# Patient Record
Sex: Male | Born: 1960 | Race: White | Hispanic: No | Marital: Single | Smoking: Current every day smoker
Health system: Southern US, Community
[De-identification: ages and names within clinical notes are randomized; demographics above are authoritative.]

## PROBLEM LIST (undated history)

## (undated) DIAGNOSIS — M109 Gout, unspecified: Secondary | ICD-10-CM

## (undated) DIAGNOSIS — M1990 Unspecified osteoarthritis, unspecified site: Secondary | ICD-10-CM

## (undated) DIAGNOSIS — D649 Anemia, unspecified: Secondary | ICD-10-CM

## (undated) DIAGNOSIS — Z9289 Personal history of other medical treatment: Secondary | ICD-10-CM

## (undated) DIAGNOSIS — R569 Unspecified convulsions: Secondary | ICD-10-CM

## (undated) HISTORY — PX: OTHER SURGICAL HISTORY: SHX169

## (undated) HISTORY — PX: LAPAROTOMY: SHX154

## (undated) HISTORY — PX: COLOSTOMY REVERSAL: SHX5782

---

## 2001-11-15 ENCOUNTER — Emergency Department (HOSPITAL_COMMUNITY): Admission: EM | Admit: 2001-11-15 | Discharge: 2001-11-16 | Payer: Self-pay | Admitting: Emergency Medicine

## 2001-11-16 ENCOUNTER — Encounter: Payer: Self-pay | Admitting: Emergency Medicine

## 2002-07-13 ENCOUNTER — Emergency Department (HOSPITAL_COMMUNITY): Admission: EM | Admit: 2002-07-13 | Discharge: 2002-07-13 | Payer: Self-pay | Admitting: Emergency Medicine

## 2002-08-11 ENCOUNTER — Emergency Department (HOSPITAL_COMMUNITY): Admission: EM | Admit: 2002-08-11 | Discharge: 2002-08-11 | Payer: Self-pay | Admitting: Emergency Medicine

## 2002-10-01 ENCOUNTER — Ambulatory Visit (HOSPITAL_COMMUNITY): Admission: RE | Admit: 2002-10-01 | Discharge: 2002-10-01 | Payer: Self-pay | Admitting: Family Medicine

## 2002-10-01 ENCOUNTER — Encounter: Payer: Self-pay | Admitting: Family Medicine

## 2002-10-22 ENCOUNTER — Ambulatory Visit (HOSPITAL_COMMUNITY): Admission: RE | Admit: 2002-10-22 | Discharge: 2002-10-22 | Payer: Self-pay | Admitting: Gastroenterology

## 2002-10-22 ENCOUNTER — Encounter: Payer: Self-pay | Admitting: Gastroenterology

## 2003-09-12 ENCOUNTER — Emergency Department (HOSPITAL_COMMUNITY): Admission: EM | Admit: 2003-09-12 | Discharge: 2003-09-12 | Payer: Self-pay | Admitting: Emergency Medicine

## 2003-09-13 ENCOUNTER — Inpatient Hospital Stay (HOSPITAL_COMMUNITY): Admission: EM | Admit: 2003-09-13 | Discharge: 2003-09-21 | Payer: Self-pay | Admitting: Emergency Medicine

## 2003-09-29 ENCOUNTER — Emergency Department (HOSPITAL_COMMUNITY): Admission: EM | Admit: 2003-09-29 | Discharge: 2003-09-29 | Payer: Self-pay | Admitting: Emergency Medicine

## 2004-04-02 IMAGING — CT CT ABDOMEN W/ CM
1 of 3 series · 14 of 32 positions shown, 19 images · IV contrast (omnipaque)
Comparison: none

CLINICAL DATA: Abdominal wall cellulitis.  Evaluate for abdominal or pelvic abscess.  Abdominal pain.
TECHNIQUE: Multidetector helical CT of the abdomen and pelvis was performed during administration of 150 cc of Omnipaque 300 intravenous contrast.  Oral contrast was also administered.
 Comparison is made with the prior exam of 10/01/02.
 ABDOMEN CT WITH CONTRAST
 The liver, gallbladder, spleen, pancreas, and adrenal glands are normal in appearance.  Several tiny renal cysts are seen but the kidneys are otherwise unremarkable.  
 A large amount of foodstuff is seen within the stomach but there is no evidence of dilated or thickened bowel loops.  There is no evidence of other inflammatory processes or abnormal fluid collections.  Surgical anastomotic staples are seen involving small bowel loops in the right abdomen.  The patient has apparently undergone previous right colectomy.
 IMPRESSION
 1.  No evidence of acute intra-abdominal process.
 2.  Tiny bilateral renal cysts incidentally noted.
 PELVIS CT WITH CONTRAST
 There is no evidence of pelvic masses or adenopathy.  There is no evidence of an inflammatory process or abnormal fluid collection in the pelvis.  Pelvic bowel loops are unremarkable in appearance.
 Negative pelvis CT.

[Series 2: abd/pelvis 5.0 b30f · axial · 0.74mm/px · z∈[-402,-22]mm · 14 of 86 slices shown, 19 images]
[im 5/86  soft-tissue]
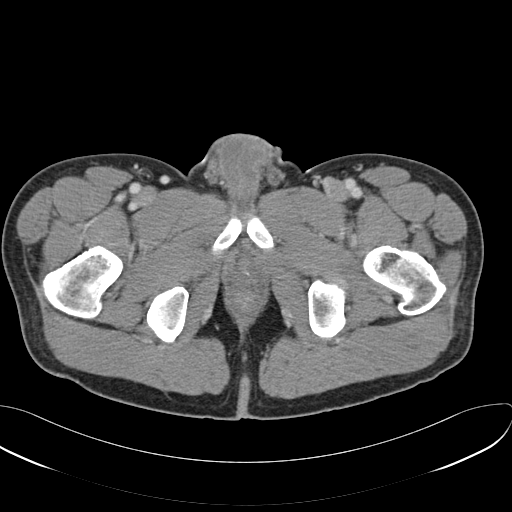
[im 5/86  bone]
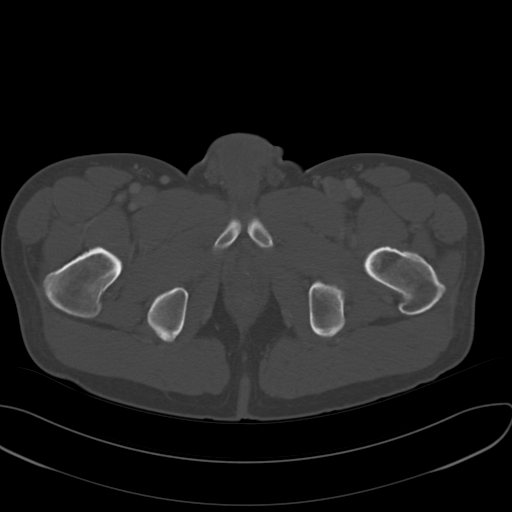
[im 13/86  soft-tissue]
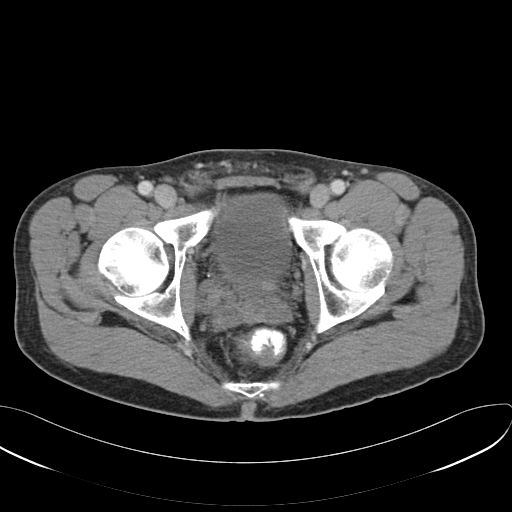
[im 18/86  soft-tissue]
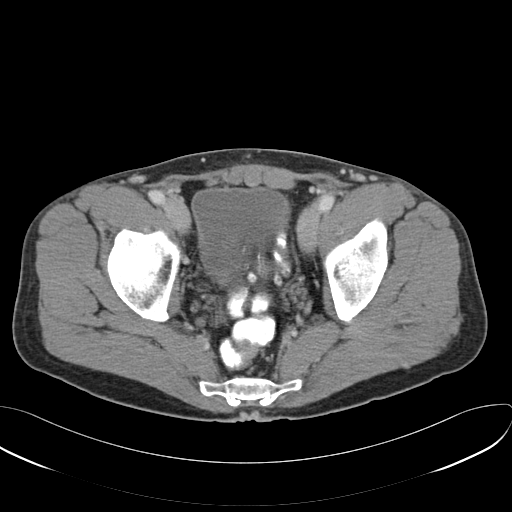
[im 26/86  soft-tissue]
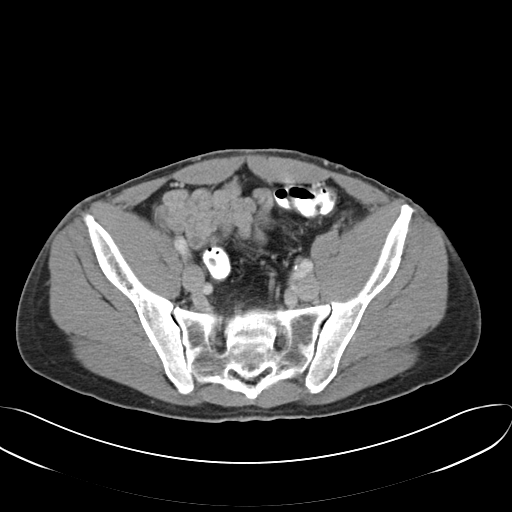
[im 30/86  soft-tissue]
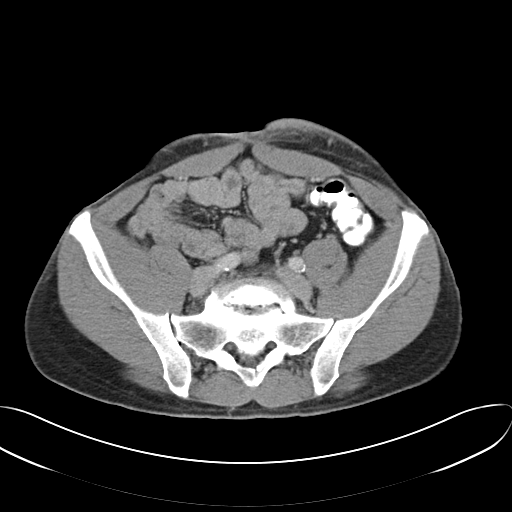
[im 39/86  soft-tissue]
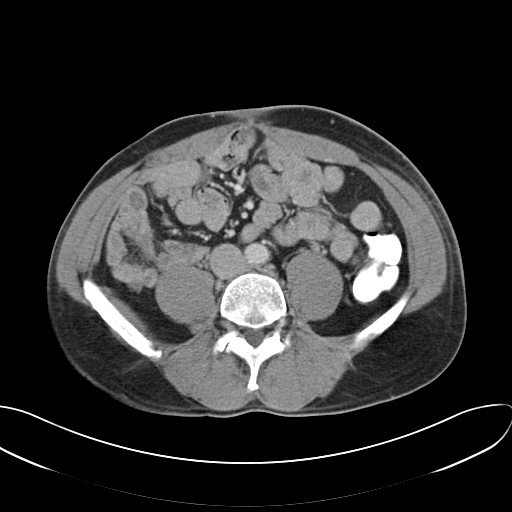
[im 43/86  soft-tissue]
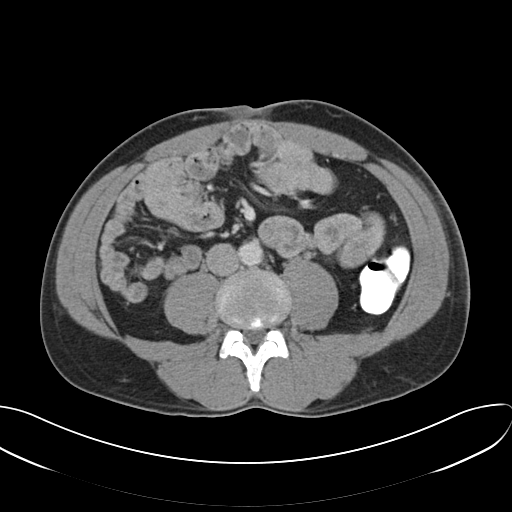
[im 47/86  soft-tissue]
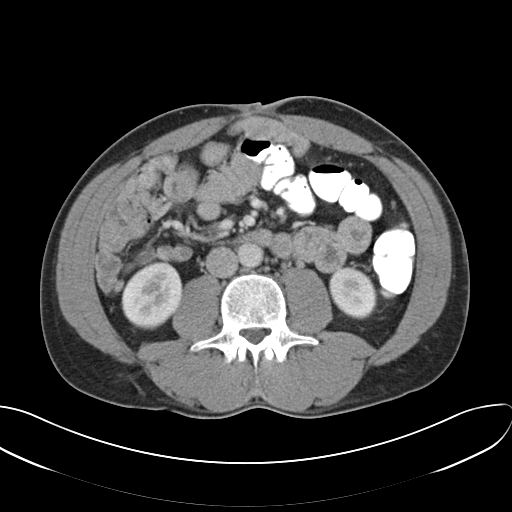
[im 56/86  soft-tissue]
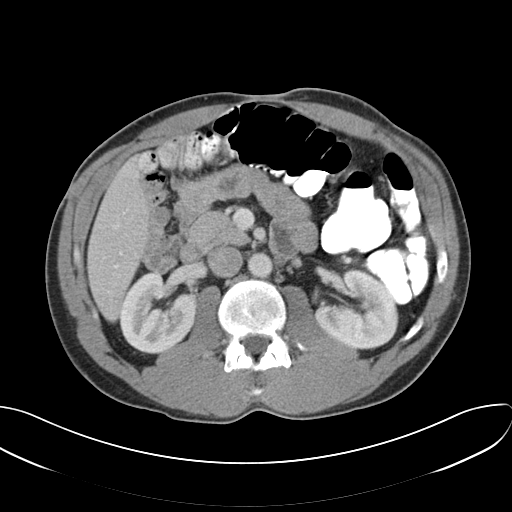
[im 56/86  bone]
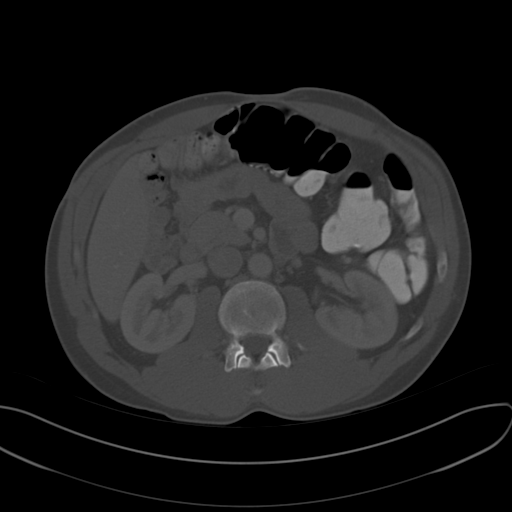
[im 60/86  soft-tissue]
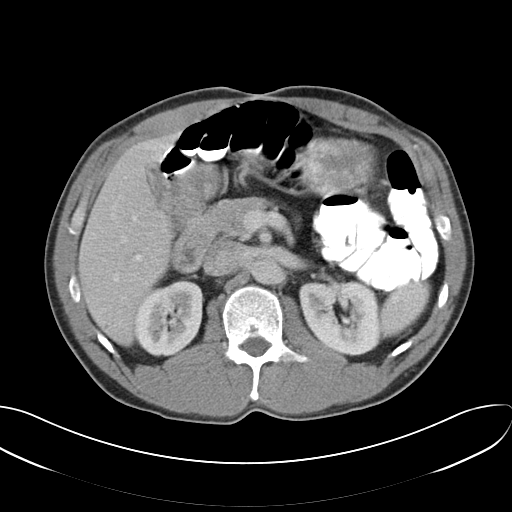
[im 69/86  soft-tissue]
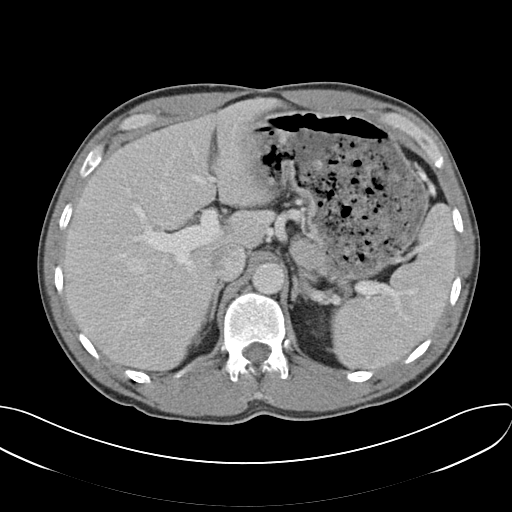
[im 69/86  lung]
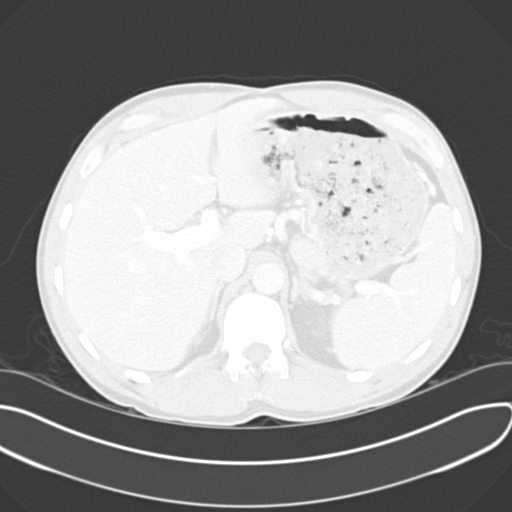
[im 73/86  soft-tissue]
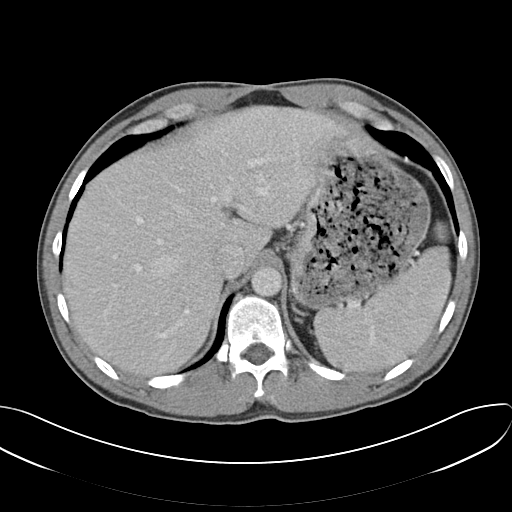
[im 73/86  lung]
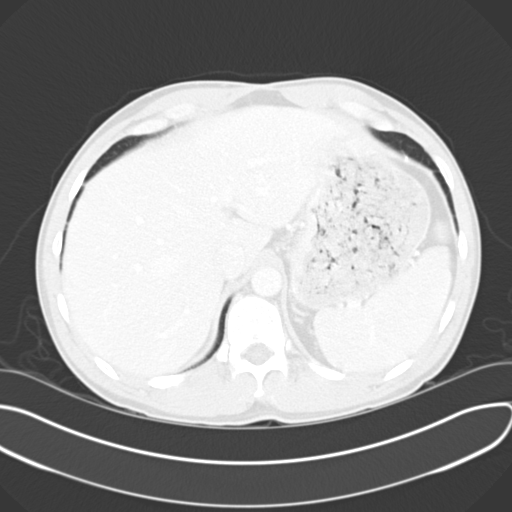
[im 77/86  lung]
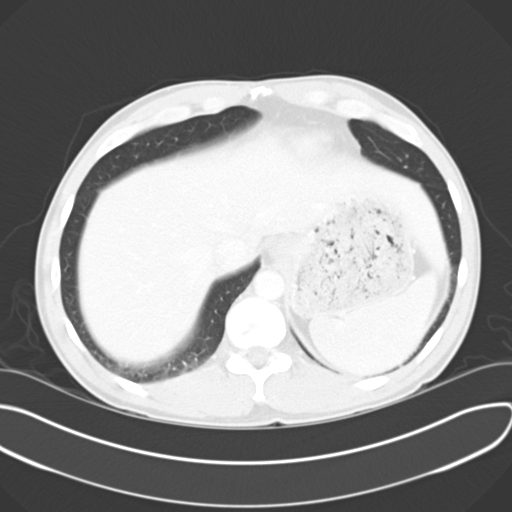
[im 81/86  soft-tissue]
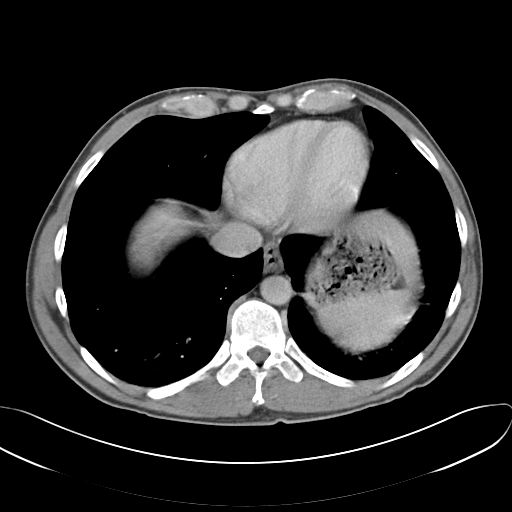
[im 81/86  lung]
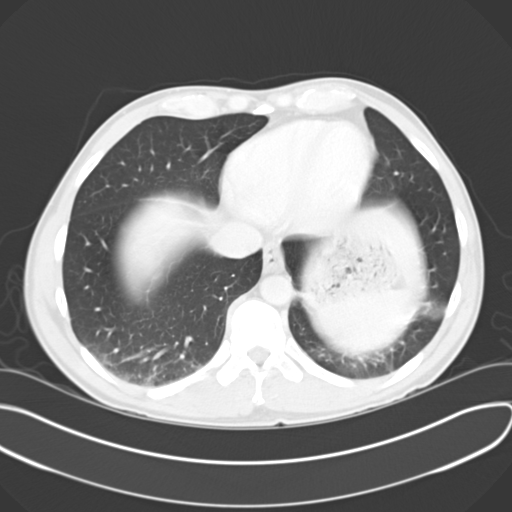

[14 of 32 positions shown; findings below may reference images not displayed]

## 2004-11-14 ENCOUNTER — Emergency Department (HOSPITAL_COMMUNITY): Admission: EM | Admit: 2004-11-14 | Discharge: 2004-11-14 | Payer: Self-pay | Admitting: Emergency Medicine

## 2006-06-05 ENCOUNTER — Emergency Department (HOSPITAL_COMMUNITY): Admission: EM | Admit: 2006-06-05 | Discharge: 2006-06-05 | Payer: Self-pay | Admitting: Emergency Medicine

## 2006-06-08 ENCOUNTER — Inpatient Hospital Stay (HOSPITAL_COMMUNITY): Admission: EM | Admit: 2006-06-08 | Discharge: 2006-06-11 | Payer: Self-pay | Admitting: Emergency Medicine

## 2006-06-08 ENCOUNTER — Encounter: Payer: Self-pay | Admitting: Vascular Surgery

## 2008-01-14 ENCOUNTER — Emergency Department (HOSPITAL_COMMUNITY): Admission: EM | Admit: 2008-01-14 | Discharge: 2008-01-14 | Payer: Self-pay | Admitting: Emergency Medicine

## 2008-01-31 ENCOUNTER — Ambulatory Visit (HOSPITAL_COMMUNITY): Admission: RE | Admit: 2008-01-31 | Discharge: 2008-01-31 | Payer: Self-pay | Admitting: Plastic Surgery

## 2009-07-18 ENCOUNTER — Emergency Department (HOSPITAL_COMMUNITY): Admission: EM | Admit: 2009-07-18 | Discharge: 2009-07-18 | Payer: Self-pay | Admitting: Emergency Medicine

## 2010-02-26 ENCOUNTER — Emergency Department (HOSPITAL_COMMUNITY): Admission: EM | Admit: 2010-02-26 | Discharge: 2010-02-27 | Payer: Self-pay | Admitting: Emergency Medicine

## 2010-09-16 LAB — DIFFERENTIAL
Basophils Absolute: 0 10*3/uL (ref 0.0–0.1)
Basophils Relative: 0 % (ref 0–1)
Eosinophils Absolute: 0 10*3/uL (ref 0.0–0.7)
Monocytes Absolute: 0.4 10*3/uL (ref 0.1–1.0)
Neutro Abs: 1.6 10*3/uL — ABNORMAL LOW (ref 1.7–7.7)

## 2010-09-16 LAB — BASIC METABOLIC PANEL
BUN: 11 mg/dL (ref 6–23)
CO2: 25 mEq/L (ref 19–32)
Calcium: 9.1 mg/dL (ref 8.4–10.5)
GFR calc non Af Amer: >60 mL/min (ref >60)
Glucose, Bld: 110 mg/dL — ABNORMAL HIGH (ref 70–99)
Potassium: 4.1 mEq/L (ref 3.5–5.1)

## 2010-09-16 LAB — CBC
HCT: 43.6 % (ref 39.0–52.0)
MCH: 31.8 pg (ref 26.0–34.0)
MCHC: 34.9 g/dL (ref 30.0–36.0)
RDW: 14.7 % (ref 11.5–15.5)
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
## 2010-11-19 NOTE — Discharge Summary (Signed)
NAME:  [name] [name], [name] [name]           ACCOUNT NO.:  [id number]   MEDICAL RECORD NO.:  [id number]          PATIENT TYPE:  INP   LOCATION:  1510                         FACILITY:  [facility]   PHYSICIAN:  Deirdre Peer. Polite, M.D. DATE OF BIRTH:  1961-07-01   DATE OF ADMISSION:  06/08/2006  DATE OF DISCHARGE:  06/11/2006                               DISCHARGE SUMMARY   DISCHARGE DIAGNOSES:  1. Prepatellar bursitis status post incision and drainage by      orthopedics consistent with MRSA.  2. Bronchitis.  3. Tobacco use.   DISCHARGE MEDICATIONS:  1. Doxicycline 100 mg 1 every 12 hours x10 days.  2. Percocet q.6 h p.r.n.   DISPOSITION:  For outpatient followup with primary MD and orthopedist as  needed.   CONSULTATIONS:  Dr. Carola Frost, orthopedics.   HISTORY OF PRESENT ILLNESS:  A 50 year old male presented to the ED for  evaluation of cellulitis of his knee. Of note, the patient had been seed  in the ED previously, had been started on antibiotics; however, did not  have improvement of his symptoms. He had continued symptoms of pain,  erythema and swelling. The patient returned to the ED, admission was  deemed necessary for further evaluation and treatment. Please see full  dictated H&P for further details.   HOSPITAL COURSE:  The patient is admitted to a medicine floor bed for  evaluation and treatment of cellulitis of the knee and associated  bronchitis. The patient was started on empiric IV antibiotics. The wound  did appear to be consistent with a community acquired MRSA infection.  The patient was seen by Dr. Carola Frost of orthopedics. The patient's wound  was I&D'd. The patient had local wound care. There were no intrahospital  complications. The patient had been cleared for discharge by  orthopedics. The patient was discharged to home in stable condition to  continue a 10-day course of doxicycline with outpatient followup with  orthopedics and primary MD. The patient was discharged to home  in stable  condition.      Deirdre Peer. Polite, M.D.  Electronically Signed     RDP/MEDQ  D:  06/30/2006  T:  06/30/2006  Job:  [id number]
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
## 2010-11-19 NOTE — Discharge Summary (Signed)
NAME:  [name] [name], [name] [name]                     ACCOUNT NO.:  [id number]   MEDICAL RECORD NO.:  [id number]                   PATIENT TYPE:  INP   LOCATION:  0477                                 FACILITY[facility]   PHYSICIAN:  Isla Pence, M.D.             DATE OF BIRTH:  08/15/1960   DATE OF ADMISSION:  09/13/2003  DATE OF DISCHARGE:  09/21/2003                                 DISCHARGE SUMMARY   DISCHARGE DIAGNOSES:  1. Abdominal wall abscess status post incision and drainage by Currie Paris, M.D. of general surgery. Vancomycin trough was done on the 16th     and that was at 18.6, this was adjusted appropriately by pharmacy and as     mentioned daily he is now off of vancomycin.  The abdominal wall abscess     grew MRSA.  2. History of anxiety and depression currently on Xanax and just recently     started on Remeron.  3. Tobacco abuse or dependence currently on nicotine patch.  4. History of alcohol abuse and dependence with a recommendation by [facility]     for ADS and AA followup.   DISCHARGE MEDICATIONS:  1. Xanax 0.25 mg p.o. b.i.d.  The patient has been given 34 of these tablets     with no refills.  2. Doxycycline 100 mg p.o. b.i.d.  He is to complete all of this. His last     dose will be after the last dose on April 1.  3. Remeron 7.5 mg p.o. q.h.s. and the recommendation for this is that he is     to stay on this dose for another 4 more days and then it will be     increased to 7.5 t.i.d. or 22.5 mg q.h.s. until he sees Dr. Donette Larry. It is     the recommendation of psychiatry, Dr. Jeanie Sewer, that as Remeron is     increased to try and taper patient off of Xanax.  4. MS Contin 15 mg p.o. b.i.d.  The patient will be given 34 tablets of     these with no refills.  5. Nicotine patch 21 mg topically once a day for one more week and then he     will be down to 14 mg topically to be then adjusted by Dr. Donette Larry.  6. Percocet 5/3.25, 1-2 tablets p.o. q. 4-6h p.r.n.   The patient has been     given 50 of these tablets with no refills.  7. Benadryl 25 mg p.o. q.i.d. p.r.n. itch.  8. Protonix 40 mg p.o. q.d.  He probably just needs this for another week or     so for GI prophylaxis.   DISCHARGE ACTIVITIES:  As tolerated.  The patient has been given the okay by  general surgery to take showers.   DIET:  There is no restrictions.   WOUND CARE:  The patient is to apply Betadine to the  wound two times a day  and dress it. He is also to do warm soaks to the wound. The wife has been  taught how to do these.   In terms of followup visit, the patient did not have a primary care visit  prior to this admission but he was willing to see someone within the [facility].  Therefore the patient was set up an appointment with Georgann Housekeeper, M.D. on  April 5 at 10:30 in the morning with [facility] internal medicine. The  patient was given the main number to call if he was going to make any  changes to this but the patient has been advised that all of his medication  changes and refills will be done through Georgann Housekeeper, M.D. and that it was  important for him to have a physician especially in light of the fact that  he is on benzodiazepine and will need adjustments on his Remeron.   HOSPITAL COURSE:  This 50 year old gentleman was admitted on the 12th by Dr.  Ladona Ridgel with abdominal cellulitis.  The patient had presented to the  emergency room on the day prior to admission with increased abdominal pain,  swelling of the site of what he thought was a spider bite. The patient was  seen by the emergency room and provided with ceftriaxone injection and sent  home with pain medication and Keflex 500.  Overnight the patient continued  to have increased redness, swelling and extension of the area to the left  flank. He had some chills and some vomiting with coughing so he returned to  the emergency room on the day of admission for further evaluation.  The  patient and his  wife related that they had recently had a small area looking  similar on her abdomen that excressed quite a bit of pus and then resolved  spontaneously. She apparently also had one on her thigh and this was also  resolved. They have scoured their home looking for possible source for these  spiders and could not locate any but they have also fully cleaned out their  house and laundered their beddings in hot water.  At the time of admission,  the findings were as listed with the area of redness that extended out into  the left flank area.  His initial white count was essentially normal at  8.7000 with differential showing neutrophils are 74%, lymphocytes art 17,  H&H was 15.5 and 46.2, platelet count of 184,000.  His repeat white count  done on the following day after IV antibiotics was initiated showed a white  count still normal at 7.6,000, H&H was stable at 14.1, 43.4, platelet count  of 170,00. His initial CNP was fairly unremarkable with a sodium of 137,  potassium of 5, chloride and CO2 106 and 27, glucose 108.  BUN and  creatinine were normal at 11 and 0.9, his LFT's were normal.  The patient  was started by Dr. Ladona Ridgel on vancomycin and Zosyn. Both these antibiotics  were continued until we obtained culture results back when the wound  starting draining two days post his admission. The wound culture had  subsequently grown MRSA.  The patient was continued on his vancomycin  through March 18 and on the 19th he was switched to doxycycline. I spoke to  infectious disease, Dr. Orvan Falconer, not as an official consult but discussed  his case with Dr. Orvan Falconer and since his CT of the abdomen and pelvis was  negative for any intraabdominal abscesses  this was a localized skin  infection.  Infectious disease had said that we could use oral antibiotics.  The MRSA was sensitive to tetracycline group.  I had asked per the advice  from ID to call up [facility] to do sensitivities on clindamycin  and bacterium and these too have come back sensitive towards his organism.  This  was recommended for possibility of future recurrence so that these options  are also available for future treatments once he completed the IV course.  In addition, three days prior to discharge, the area had localized to an  area of induration of about 5 cm.  General surgery was consulted and Dr.  Jamey Ripa saw him in consultation to perform an I&D and this was done on the  18th of March. Since then his culture from that has also grown the same  showing MRSA.  The patient's wound has been dressed and it is on the day of  discharge approximating very well without needing the iodoform in site  packing.  The wife is familiar with doing abdominal wound dressings since he  has prior abdominal surgeries and she needed to do these dressing changes  during that time also.  His blood cultures done on the day of admission  which was just one set has remained negative. The patient has remained  afebrile during the entire course except for one episode of a low grade  temperature at 99.   The patient also gives history of alcohol abuse with concerns that to have  some pain coverage Dr. Ladona Ridgel had just placed him on MS Contin and had also  allowed him small doses of Dilaudid and Percocet. The patient is going to be  going home with the MS Contin and Percocet as listed above under the  discharge medications.  The patient has been told about these medications.   With concerns of history of previous depression and anxiety and him having  some kind of reactions to previous SSRI and the concern for his alcohol  abuse in the past, I consulted Dr. Jeanie Sewer of inpatient psychiatry to see  this patient for appropriate treatment. Dr. Jeanie Sewer recommended Remeron  which the patient was willing to take and he was initiated at 7.5 mg p.o.  q.h.s. which he appears to be tolerating at this point in time. The plan is  as the Remeron is  increased that his xanax can be tapered to off. It was  also recommended by Dr. Jeanie Sewer that if he tolerates Remeron to add Celexa  at 5 mg q.a.m. to start where Remeron will block the 5H2 receptors.  This  will help with minimizing __________ problems with the Celexa.  Dr.  Jeanie Sewer had also recommended to the patient to have ADS and AA followup.   The patient's last BNP on the 17th of March was still normal with a  creatinine of 1.4.  Sodium is 130, potassium 4.3, glucose of 99.   The patient is being discharged to home in stable condition with a followup  and wound care as previously mentioned.                                               Isla Pence, M.D.    RRV/MEDQ  D:  09/21/2003  T:  09/23/2003  Job:  [id number]   cc:   Georgann Housekeeper,  M.D.  [address]  [place]  [place] [address]  Fax: [phone]
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
## 2010-11-19 NOTE — Op Note (Signed)
NAME:  [name] [name], [name] [name]           ACCOUNT NO.:  [id number]   MEDICAL RECORD NO.:  [id number]          PATIENT TYPE:  INP   LOCATION:  1510                         FACILITY:  [facility]   PHYSICIAN:  Doralee Albino. Carola Frost, M.D. DATE OF BIRTH:  12-Jun-1961   DATE OF PROCEDURE:  06/08/2006  DATE OF DISCHARGE:                               OPERATIVE REPORT   PREOPERATIVE DIAGNOSES:  Right prepatellar septic bursitis.   POSTOPERATIVE DIAGNOSES:  Right prepatellar septic bursitis.   OPERATIVE PROCEDURE:  Incision of the prepatellar bursa and drainage.   SURGEON:  Doralee Albino. Carola Frost, M.D.   ASSISTANT:  None.   ANESTHESIA:  Local using 4 mL of lidocaine.   SPECIMENS:  Two aerobic and anaerobic cultures obtained from the bursal  space.   DRAINS:  None.   COMPLICATIONS:  None.   FINDINGS:  The patient did not appear to have any loculated infection or  abscess in the prepatellar space.  We did have a fairly aggressive look  into the prepatellar space making 1.5 cm incision and using the scissors  to spread into this area.  The cut was made quite deeply as well and at  this time it simply appears there is no loculated abscess.  Scissor tips  were also used to lance some of the pustules and these did not  communicate deeply into any discernible pocket.   RECOMMENDATIONS:  At this time I would simply mobilize the knee, apply  compressive dressing and continue with IV antibiotics.  The cultures  will be followed up on and further recommendations based upon resolution  of the infection with IV antibiotics only.      Doralee Albino. Carola Frost, M.D.  Electronically Signed     MHH/MEDQ  D:  06/08/2006  T:  06/09/2006  Job:  [id number]

## 2011-04-01 LAB — CBC
Hemoglobin: 16.4
MCHC: 33.5
Platelets: 195
RDW: 14.7

## 2011-04-01 LAB — BASIC METABOLIC PANEL
BUN: 9
Calcium: 9.7
Creatinine, Ser: 1.1
GFR calc non Af Amer: >60
Glucose, Bld: 91
Sodium: 142

## 2012-09-04 ENCOUNTER — Emergency Department (HOSPITAL_COMMUNITY): Payer: Self-pay

## 2012-09-04 ENCOUNTER — Encounter (HOSPITAL_COMMUNITY): Payer: Self-pay | Admitting: Family Medicine

## 2012-09-04 ENCOUNTER — Inpatient Hospital Stay (HOSPITAL_COMMUNITY)
Admission: EM | Admit: 2012-09-04 | Discharge: 2012-09-10 | DRG: 494 | Disposition: A | Discharge status: Home Health | Payer: MEDICAID | Attending: Orthopedic Surgery | Admitting: Orthopedic Surgery

## 2012-09-04 DIAGNOSIS — Z888 Allergy status to other drugs, medicaments and biological substances status: Secondary | ICD-10-CM

## 2012-09-04 DIAGNOSIS — S82001A Unspecified fracture of right patella, initial encounter for closed fracture: Secondary | ICD-10-CM

## 2012-09-04 DIAGNOSIS — Y92009 Unspecified place in unspecified non-institutional (private) residence as the place of occurrence of the external cause: Secondary | ICD-10-CM

## 2012-09-04 DIAGNOSIS — S82009A Unspecified fracture of unspecified patella, initial encounter for closed fracture: Principal | ICD-10-CM | POA: Diagnosis present

## 2012-09-04 MED ORDER — HYDROMORPHONE HCL PF 2 MG/ML IJ SOLN
2.0000 mg | Freq: Once | INTRAMUSCULAR | Status: AC
  Administered 2012-09-04: 2 mg via INTRAVENOUS
  Filled 2012-09-04: qty 1

## 2012-09-04 MED ORDER — ONDANSETRON HCL 4 MG/2ML IJ SOLN
4.0000 mg | Freq: Once | INTRAMUSCULAR | Status: AC
  Administered 2012-09-04: 4 mg via INTRAVENOUS
  Filled 2012-09-04: qty 2
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
## 2012-09-05 MED ORDER — ZOLPIDEM TARTRATE 5 MG PO TABS
10.0000 mg | ORAL_TABLET | Freq: Every evening | ORAL | Status: DC | PRN
  Administered 2012-09-05 – 2012-09-09 (×4): 10 mg via ORAL
  Filled 2012-09-05: qty 2
  Filled 2012-09-05 (×2): qty 1
  Filled 2012-09-05 (×2): qty 2

## 2012-09-05 MED ORDER — OXYCODONE-ACETAMINOPHEN 5-325 MG PO TABS
1.0000 | ORAL_TABLET | Freq: Four times a day (QID) | ORAL | Status: DC | PRN
  Administered 2012-09-05 – 2012-09-06 (×3): 2 via ORAL
  Filled 2012-09-05 (×2): qty 2

## 2012-09-05 MED ORDER — CHLORDIAZEPOXIDE HCL 5 MG PO CAPS: 25.0000 mg | ORAL_CAPSULE | Freq: Four times a day (QID) | ORAL | Status: AC | PRN

## 2012-09-05 MED ORDER — LOPERAMIDE HCL 2 MG PO CAPS: 2.0000 mg | ORAL_CAPSULE | ORAL | Status: AC | PRN

## 2012-09-05 MED ORDER — ONDANSETRON 4 MG PO TBDP
4.0000 mg | ORAL_TABLET | Freq: Four times a day (QID) | ORAL | Status: AC | PRN
  Filled 2012-09-05: qty 1

## 2012-09-05 MED ORDER — OXYCODONE-ACETAMINOPHEN 5-325 MG PO TABS
1.0000 | ORAL_TABLET | Freq: Four times a day (QID) | ORAL | Status: DC | PRN
Start: 2012-09-05 — End: 2012-09-05
  Administered 2012-09-05: 1 via ORAL
  Filled 2012-09-05: qty 1

## 2012-09-05 MED ORDER — HYDROMORPHONE HCL PF 1 MG/ML IJ SOLN
0.5000 mg | INTRAMUSCULAR | Status: DC | PRN
  Administered 2012-09-05 (×2): 1 mg via INTRAVENOUS
  Administered 2012-09-05 – 2012-09-06 (×3): 2 mg via INTRAVENOUS
  Administered 2012-09-06 (×2): 1 mg via INTRAVENOUS
  Administered 2012-09-06: 2 mg via INTRAVENOUS
  Administered 2012-09-06: 1 mg via INTRAVENOUS
  Administered 2012-09-07 – 2012-09-08 (×9): 2 mg via INTRAVENOUS
  Administered 2012-09-08: 1 mg via INTRAVENOUS
  Administered 2012-09-08 – 2012-09-09 (×4): 2 mg via INTRAVENOUS
  Administered 2012-09-09: 1 mg via INTRAVENOUS
  Administered 2012-09-09 – 2012-09-10 (×3): 2 mg via INTRAVENOUS
  Filled 2012-09-05: qty 2
  Filled 2012-09-05: qty 1
  Filled 2012-09-05 (×7): qty 2
  Filled 2012-09-05: qty 1
  Filled 2012-09-05 (×3): qty 2
  Filled 2012-09-05: qty 1
  Filled 2012-09-05 (×2): qty 2
  Filled 2012-09-05 (×3): qty 1
  Filled 2012-09-05 (×3): qty 2
  Filled 2012-09-05: qty 1
  Filled 2012-09-05 (×5): qty 2

## 2012-09-05 MED ORDER — VITAMIN B-1 100 MG PO TABS
100.0000 mg | ORAL_TABLET | Freq: Every day | ORAL | Status: DC
  Administered 2012-09-06 – 2012-09-09 (×3): 100 mg via ORAL
  Filled 2012-09-05 (×5): qty 1

## 2012-09-05 MED ORDER — ADULT MULTIVITAMIN W/MINERALS CH
1.0000 | ORAL_TABLET | Freq: Every day | ORAL | Status: DC
  Administered 2012-09-05 – 2012-09-09 (×4): 1 via ORAL
  Filled 2012-09-05 (×7): qty 1

## 2012-09-05 MED ORDER — HYDROXYZINE HCL 25 MG PO TABS
25.0000 mg | ORAL_TABLET | Freq: Four times a day (QID) | ORAL | Status: AC | PRN
  Administered 2012-09-05: 25 mg via ORAL
  Filled 2012-09-05: qty 1

## 2012-09-05 MED ORDER — THIAMINE HCL 100 MG/ML IJ SOLN
100.0000 mg | Freq: Once | INTRAMUSCULAR | Status: AC
  Administered 2012-09-05: 100 mg via INTRAMUSCULAR
  Filled 2012-09-05: qty 2

## 2012-09-05 MED ORDER — SODIUM CHLORIDE 0.9 % IV SOLN
INTRAVENOUS | Status: AC
  Administered 2012-09-05: 05:00:00 via INTRAVENOUS

## 2012-09-05 MED ORDER — HYDROCODONE-ACETAMINOPHEN 5-325 MG PO TABS
1.0000 | ORAL_TABLET | ORAL | Status: DC | PRN
  Administered 2012-09-05: 2 via ORAL
  Filled 2012-09-05 (×2): qty 2

## 2012-09-05 NOTE — ED Notes (Signed)
Per Kathreen Cornfield, pt is not NPO at this time, pt allowed to take PO pain medication at this time.
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
## 2012-09-05 NOTE — Progress Notes (Signed)
Nutrition Brief Note  Patient identified on the Malnutrition Screening Tool (MST) Report for recent weight loss without trying (patient unsure).  Wt Readings from Last 10 Encounters:  09/05/12 160 lb 3.2 oz (72.666 kg)    Body mass index is 22.99 kg/(m^2). Patient meets criteria for Normal based on current BMI.   Current diet order is Regular.  Labs and medications reviewed.   No nutrition interventions warranted at this time. If nutrition issues arise, please consult RD.   Maureen Chatters, RD, LDN Pager #: [phone] After-Hours Pager #: [phone]

## 2012-09-05 NOTE — ED Notes (Signed)
Kenney Houseman pt's daughter, called to check on pt. Pt gave permission for me to update her on pt's status, I did so at bedside with assistance of the pt.

## 2012-09-06 MED ORDER — CHLORHEXIDINE GLUCONATE CLOTH 2 % EX PADS
6.0000 | MEDICATED_PAD | Freq: Every day | CUTANEOUS | Status: DC
  Administered 2012-09-07: 6 via TOPICAL

## 2012-09-06 MED ORDER — MUPIROCIN 2 % EX OINT
1.0000 "application " | TOPICAL_OINTMENT | Freq: Two times a day (BID) | CUTANEOUS | Status: DC
  Administered 2012-09-06 – 2012-09-09 (×6): 1 via NASAL
  Filled 2012-09-06 (×2): qty 22

## 2012-09-06 MED ORDER — OXYCODONE HCL 5 MG PO TABS
5.0000 mg | ORAL_TABLET | ORAL | Status: DC | PRN
  Administered 2012-09-07 (×2): 5 mg via ORAL
  Filled 2012-09-06 (×2): qty 1

## 2012-09-06 MED ORDER — OXYCODONE-ACETAMINOPHEN 5-325 MG PO TABS
1.0000 | ORAL_TABLET | ORAL | Status: DC | PRN
  Administered 2012-09-07 (×2): 1 via ORAL
  Filled 2012-09-06 (×2): qty 1

## 2012-09-06 NOTE — Progress Notes (Signed)
Utilization review completed. Bertha Stanfill, RN, BSN. 
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
## 2012-09-06 NOTE — Progress Notes (Signed)
Orthopedic Tech Progress Note Patient Details:  BENZ VANDENBERGHE September 22, 1960 [id number]  Ortho Devices Type of Ortho Device: Knee Immobilizer Ortho Device/Splint Location: right leg Ortho Device/Splint Interventions: Application   Crawford, Rembert 09/06/2012, 9:30 PM

## 2012-09-07 MED ORDER — ENOXAPARIN SODIUM 40 MG/0.4ML ~~LOC~~ SOLN
40.0000 mg | Freq: Once | SUBCUTANEOUS | Status: AC
  Administered 2012-09-07: 40 mg via SUBCUTANEOUS
  Filled 2012-09-07: qty 0.4

## 2012-09-07 MED ORDER — OXYCODONE-ACETAMINOPHEN 5-325 MG PO TABS
1.0000 | ORAL_TABLET | ORAL | Status: DC | PRN
  Administered 2012-09-09 – 2012-09-10 (×5): 1 via ORAL
  Filled 2012-09-07 (×5): qty 1

## 2012-09-07 MED ORDER — OXYCODONE HCL 5 MG PO TABS
5.0000 mg | ORAL_TABLET | ORAL | Status: DC
  Administered 2012-09-07: 5 mg via ORAL
  Administered 2012-09-07 – 2012-09-10 (×13): 10 mg via ORAL
  Filled 2012-09-07 (×14): qty 2
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
## 2012-09-08 ENCOUNTER — Encounter (HOSPITAL_COMMUNITY): Payer: Self-pay | Admitting: *Deleted

## 2012-09-08 ENCOUNTER — Encounter (HOSPITAL_COMMUNITY)
Admission: EM | Disposition: A | Discharge status: Home Health | Payer: Self-pay | Source: Home / Self Care | Attending: Orthopedic Surgery

## 2012-09-08 ENCOUNTER — Inpatient Hospital Stay (HOSPITAL_COMMUNITY): Payer: MEDICAID

## 2012-09-08 ENCOUNTER — Inpatient Hospital Stay (HOSPITAL_COMMUNITY): Payer: MEDICAID | Admitting: *Deleted

## 2012-09-08 HISTORY — PX: ORIF PATELLA: SHX5033

## 2012-09-08 LAB — DIFFERENTIAL
Basophils Relative: 0 % (ref 0–1)
Eosinophils Absolute: 0.1 10*3/uL (ref 0.0–0.7)
Lymphs Abs: 1.9 10*3/uL (ref 0.7–4.0)
Neutro Abs: 3.7 10*3/uL (ref 1.7–7.7)
Neutrophils Relative %: 59 % (ref 43–77)

## 2012-09-08 LAB — CBC
MCH: 31.6 pg (ref 26.0–34.0)
MCHC: 34.5 g/dL (ref 30.0–36.0)
Platelets: 140 10*3/uL — ABNORMAL LOW (ref 150–400)
RBC: 4.5 MIL/uL (ref 4.22–5.81)

## 2012-09-08 SURGERY — OPEN REDUCTION INTERNAL FIXATION (ORIF) PATELLA
Anesthesia: General | Site: Knee | Laterality: Right

## 2012-09-08 SURGERY — OPEN REDUCTION INTERNAL FIXATION (ORIF) PATELLA
Anesthesia: General | Site: Knee | Laterality: Right | Wound class: Clean

## 2012-09-08 MED ORDER — MEPERIDINE HCL 25 MG/ML IJ SOLN: 6.2500 mg | INTRAMUSCULAR | Status: DC | PRN

## 2012-09-08 MED ORDER — METOCLOPRAMIDE HCL 5 MG/ML IJ SOLN: 5.0000 mg | Freq: Three times a day (TID) | INTRAMUSCULAR | Status: DC | PRN

## 2012-09-08 MED ORDER — ONDANSETRON HCL 4 MG/2ML IJ SOLN
INTRAMUSCULAR | Status: DC | PRN
  Administered 2012-09-08: 4 mg via INTRAVENOUS

## 2012-09-08 MED ORDER — METHOCARBAMOL 500 MG PO TABS
500.0000 mg | ORAL_TABLET | Freq: Four times a day (QID) | ORAL | Status: DC | PRN
  Administered 2012-09-08 – 2012-09-10 (×4): 500 mg via ORAL
  Filled 2012-09-08 (×4): qty 1

## 2012-09-08 MED ORDER — RIVAROXABAN 10 MG PO TABS
10.0000 mg | ORAL_TABLET | Freq: Every day | ORAL | Status: DC
  Administered 2012-09-09 – 2012-09-10 (×2): 10 mg via ORAL
  Filled 2012-09-08 (×3): qty 1

## 2012-09-08 MED ORDER — POLYETHYLENE GLYCOL 3350 17 G PO PACK: 17.0000 g | PACK | Freq: Two times a day (BID) | ORAL | Status: DC

## 2012-09-08 MED ORDER — METOCLOPRAMIDE HCL 10 MG PO TABS: 5.0000 mg | ORAL_TABLET | Freq: Three times a day (TID) | ORAL | Status: DC | PRN

## 2012-09-08 MED ORDER — ONDANSETRON HCL 4 MG/2ML IJ SOLN: 4.0000 mg | Freq: Four times a day (QID) | INTRAMUSCULAR | Status: DC | PRN

## 2012-09-08 MED ORDER — OXYCODONE HCL 5 MG/5ML PO SOLN: 5.0000 mg | Freq: Once | ORAL | Status: AC | PRN

## 2012-09-08 MED ORDER — ACETAMINOPHEN 325 MG PO TABS: 650.0000 mg | ORAL_TABLET | Freq: Four times a day (QID) | ORAL | Status: DC | PRN

## 2012-09-08 MED ORDER — LIDOCAINE HCL 4 % MT SOLN
OROMUCOSAL | Status: DC | PRN
  Administered 2012-09-08: 4 mL via TOPICAL

## 2012-09-08 MED ORDER — FENTANYL CITRATE 0.05 MG/ML IJ SOLN
INTRAMUSCULAR | Status: DC | PRN
  Administered 2012-09-08: 50 ug via INTRAVENOUS
  Administered 2012-09-08: 100 ug via INTRAVENOUS

## 2012-09-08 MED ORDER — 0.9 % SODIUM CHLORIDE (POUR BTL) OPTIME
TOPICAL | Status: DC | PRN
  Administered 2012-09-08: 1000 mL

## 2012-09-08 MED ORDER — EPHEDRINE SULFATE 50 MG/ML IJ SOLN
INTRAMUSCULAR | Status: DC | PRN
  Administered 2012-09-08 (×2): 5 mg via INTRAVENOUS

## 2012-09-08 MED ORDER — ONDANSETRON HCL 4 MG PO TABS: 4.0000 mg | ORAL_TABLET | Freq: Four times a day (QID) | ORAL | Status: DC | PRN

## 2012-09-08 MED ORDER — HYDROMORPHONE HCL PF 1 MG/ML IJ SOLN
0.2500 mg | INTRAMUSCULAR | Status: DC | PRN
  Administered 2012-09-08 (×4): 0.5 mg via INTRAVENOUS

## 2012-09-08 MED ORDER — ACETAMINOPHEN 650 MG RE SUPP: 650.0000 mg | Freq: Four times a day (QID) | RECTAL | Status: DC | PRN

## 2012-09-08 MED ORDER — PHENYLEPHRINE HCL 10 MG/ML IJ SOLN
INTRAMUSCULAR | Status: DC | PRN
  Administered 2012-09-08 (×2): 80 ug via INTRAVENOUS
  Administered 2012-09-08 (×2): 40 ug via INTRAVENOUS
  Administered 2012-09-08 (×2): 80 ug via INTRAVENOUS

## 2012-09-08 MED ORDER — METHOCARBAMOL 500 MG PO TABS: 500.0000 mg | ORAL_TABLET | Freq: Four times a day (QID) | ORAL | Status: DC | PRN

## 2012-09-08 MED ORDER — CEFAZOLIN SODIUM-DEXTROSE 2-3 GM-% IV SOLR
2.0000 g | Freq: Once | INTRAVENOUS | Status: AC
  Administered 2012-09-08: 2 g via INTRAVENOUS

## 2012-09-08 MED ORDER — MIDAZOLAM HCL 5 MG/5ML IJ SOLN
INTRAMUSCULAR | Status: DC | PRN
  Administered 2012-09-08: 2 mg via INTRAVENOUS

## 2012-09-08 MED ORDER — NEOSTIGMINE METHYLSULFATE 1 MG/ML IJ SOLN
INTRAMUSCULAR | Status: DC | PRN
  Administered 2012-09-08 (×2): 2 mg via INTRAVENOUS

## 2012-09-08 MED ORDER — ROCURONIUM BROMIDE 100 MG/10ML IV SOLN
INTRAVENOUS | Status: DC | PRN
  Administered 2012-09-08: 40 mg via INTRAVENOUS
  Administered 2012-09-08: 20 mg via INTRAVENOUS
  Administered 2012-09-08: 10 mg via INTRAVENOUS

## 2012-09-08 MED ORDER — ASPIRIN EC 325 MG PO TBEC: 325.0000 mg | DELAYED_RELEASE_TABLET | Freq: Two times a day (BID) | ORAL | Status: DC

## 2012-09-08 MED ORDER — OXYCODONE HCL 5 MG PO TABS: 5.0000 mg | ORAL_TABLET | ORAL | Status: DC | PRN

## 2012-09-08 MED ORDER — PROPOFOL 10 MG/ML IV BOLUS
INTRAVENOUS | Status: DC | PRN
  Administered 2012-09-08: 150 mg via INTRAVENOUS

## 2012-09-08 MED ORDER — DOCUSATE SODIUM 100 MG PO CAPS
100.0000 mg | ORAL_CAPSULE | Freq: Two times a day (BID) | ORAL | Status: DC
  Administered 2012-09-08 – 2012-09-09 (×4): 100 mg via ORAL
  Filled 2012-09-08 (×6): qty 1

## 2012-09-08 MED ORDER — KETOROLAC TROMETHAMINE 15 MG/ML IJ SOLN
7.5000 mg | Freq: Four times a day (QID) | INTRAMUSCULAR | Status: AC
  Administered 2012-09-08 – 2012-09-09 (×3): 7.5 mg via INTRAVENOUS
  Filled 2012-09-08 (×4): qty 1

## 2012-09-08 MED ORDER — DSS 100 MG PO CAPS: 100.0000 mg | ORAL_CAPSULE | Freq: Two times a day (BID) | ORAL | Status: DC

## 2012-09-08 MED ORDER — MENTHOL 3 MG MT LOZG: 1.0000 | LOZENGE | OROMUCOSAL | Status: DC | PRN

## 2012-09-08 MED ORDER — POLYETHYLENE GLYCOL 3350 17 G PO PACK
17.0000 g | PACK | Freq: Every day | ORAL | Status: DC
  Administered 2012-09-08 – 2012-09-09 (×2): 17 g via ORAL
  Filled 2012-09-08 (×3): qty 1

## 2012-09-08 MED ORDER — SODIUM CHLORIDE 0.9 % IV SOLN
INTRAVENOUS | Status: DC
  Administered 2012-09-08: 14:00:00 via INTRAVENOUS
  Administered 2012-09-10: 50 mL/h via INTRAVENOUS

## 2012-09-08 MED ORDER — GLYCOPYRROLATE 0.2 MG/ML IJ SOLN
INTRAMUSCULAR | Status: DC | PRN
  Administered 2012-09-08: 0.2 mg via INTRAVENOUS
  Administered 2012-09-08: 0.4 mg via INTRAVENOUS

## 2012-09-08 MED ORDER — LIDOCAINE HCL (CARDIAC) 20 MG/ML IV SOLN
INTRAVENOUS | Status: DC | PRN
  Administered 2012-09-08: 30 mg via INTRAVENOUS

## 2012-09-08 MED ORDER — OXYCODONE HCL 5 MG PO TABS: 5.0000 mg | ORAL_TABLET | Freq: Once | ORAL | Status: AC | PRN

## 2012-09-08 MED ORDER — DEXTROSE 5 % IV SOLN
500.0000 mg | Freq: Four times a day (QID) | INTRAVENOUS | Status: DC | PRN
  Filled 2012-09-08: qty 5

## 2012-09-08 MED ORDER — PHENOL 1.4 % MT LIQD: 1.0000 | OROMUCOSAL | Status: DC | PRN

## 2012-09-08 MED ORDER — PROMETHAZINE HCL 25 MG/ML IJ SOLN
6.2500 mg | INTRAMUSCULAR | Status: DC | PRN
  Filled 2012-09-08: qty 1

## 2012-09-08 MED ORDER — MIDAZOLAM HCL 2 MG/2ML IJ SOLN: 0.5000 mg | Freq: Once | INTRAMUSCULAR | Status: AC | PRN

## 2012-09-08 MED ORDER — ALBUTEROL SULFATE HFA 108 (90 BASE) MCG/ACT IN AERS
INHALATION_SPRAY | RESPIRATORY_TRACT | Status: DC | PRN
  Administered 2012-09-08 (×2): 2 via RESPIRATORY_TRACT

## 2012-09-08 MED ORDER — LACTATED RINGERS IV SOLN
INTRAVENOUS | Status: DC | PRN
  Administered 2012-09-08 (×2): via INTRAVENOUS

## 2012-09-08 MED ORDER — BUPIVACAINE-EPINEPHRINE PF 0.5-1:200000 % IJ SOLN
INTRAMUSCULAR | Status: DC | PRN
  Administered 2012-09-08: 30 mL

## 2012-09-08 SURGICAL SUPPLY — 61 items
ADH SKN CLS APL DERMABOND .7 (GAUZE/BANDAGES/DRESSINGS) ×1
BANDAGE ELASTIC 4 VELCRO ST LF (GAUZE/BANDAGES/DRESSINGS) ×2 IMPLANT
BANDAGE ELASTIC 6 VELCRO ST LF (GAUZE/BANDAGES/DRESSINGS) ×2 IMPLANT
BANDAGE ESMARK 6X9 LF (GAUZE/BANDAGES/DRESSINGS) ×1 IMPLANT
BLADE SURG ROTATE 9660 (MISCELLANEOUS) ×2 IMPLANT
BNDG CMPR 9X6 STRL LF SNTH (GAUZE/BANDAGES/DRESSINGS) ×1
BNDG COHESIVE 6X5 TAN STRL LF (GAUZE/BANDAGES/DRESSINGS) ×2 IMPLANT
BNDG ESMARK 6X9 LF (GAUZE/BANDAGES/DRESSINGS) ×2
CLOTH BEACON ORANGE TIMEOUT ST (SAFETY) ×2 IMPLANT
CLSR STERI-STRIP ANTIMIC 1/2X4 (GAUZE/BANDAGES/DRESSINGS) ×1 IMPLANT
COVER SURGICAL LIGHT HANDLE (MISCELLANEOUS) ×2 IMPLANT
CUFF TOURNIQUET SINGLE 34IN LL (TOURNIQUET CUFF) ×1 IMPLANT
CUFF TOURNIQUET SINGLE 44IN (TOURNIQUET CUFF) IMPLANT
DERMABOND ADVANCED (GAUZE/BANDAGES/DRESSINGS) ×1
DERMABOND ADVANCED .7 DNX12 (GAUZE/BANDAGES/DRESSINGS) IMPLANT
DRAPE C-ARM 42X72 X-RAY (DRAPES) ×1 IMPLANT
DRAPE C-ARMOR (DRAPES) ×1 IMPLANT
DRAPE U-SHAPE 47X51 STRL (DRAPES) ×2 IMPLANT
DRILL BIT 7/64X5 (BIT) ×2 IMPLANT
DRSG ADAPTIC 3X8 NADH LF (GAUZE/BANDAGES/DRESSINGS) ×2 IMPLANT
DRSG PAD ABDOMINAL 8X10 ST (GAUZE/BANDAGES/DRESSINGS) ×2 IMPLANT
ELECT REM PT RETURN 9FT ADLT (ELECTROSURGICAL) ×2
ELECTRODE REM PT RTRN 9FT ADLT (ELECTROSURGICAL) ×1 IMPLANT
FACESHIELD LNG OPTICON STERILE (SAFETY) ×4 IMPLANT
GLOVE BIOGEL PI IND STRL 7.5 (GLOVE) ×1 IMPLANT
GLOVE BIOGEL PI IND STRL 8 (GLOVE) ×1 IMPLANT
GLOVE BIOGEL PI INDICATOR 7.5 (GLOVE) ×1
GLOVE BIOGEL PI INDICATOR 8 (GLOVE) ×1
GLOVE ORTHO TXT STRL SZ7.5 (GLOVE) ×2 IMPLANT
GLOVE SURG ORTHO 8.0 STRL STRW (GLOVE) ×2 IMPLANT
GUIDEWIRE ORTH 6X062XTROC NS (WIRE) IMPLANT
IV CATH 14GX2 1/4 (CATHETERS) ×1 IMPLANT
K-WIRE .062 (WIRE) ×4
KIT BASIN OR (CUSTOM PROCEDURE TRAY) ×2 IMPLANT
KIT ROOM TURNOVER OR (KITS) ×2 IMPLANT
MANIFOLD NEPTUNE II (INSTRUMENTS) ×1 IMPLANT
NEEDLE 22X1 1/2 (OR ONLY) (NEEDLE) IMPLANT
NS IRRIG 1000ML POUR BTL (IV SOLUTION) ×2 IMPLANT
PACK ORTHO EXTREMITY (CUSTOM PROCEDURE TRAY) ×2 IMPLANT
PAD ARMBOARD 7.5X6 YLW CONV (MISCELLANEOUS) ×4 IMPLANT
PAD CAST 4YDX4 CTTN HI CHSV (CAST SUPPLIES) ×2 IMPLANT
PADDING CAST COTTON 4X4 STRL (CAST SUPPLIES) ×2
SPONGE GAUZE 4X4 12PLY (GAUZE/BANDAGES/DRESSINGS) ×1 IMPLANT
SPONGE LAP 4X18 X RAY DECT (DISPOSABLE) ×2 IMPLANT
STAPLER VISISTAT 35W (STAPLE) ×2 IMPLANT
STOCKINETTE IMPERVIOUS LG (DRAPES) ×2 IMPLANT
SUCTION FRAZIER TIP 10 FR DISP (SUCTIONS) ×1 IMPLANT
SUT MNCRL AB 4-0 PS2 18 (SUTURE) ×1 IMPLANT
SUT STEEL 7 (SUTURE) IMPLANT
SUT VIC AB 0 CTB1 27 (SUTURE) ×2 IMPLANT
SUT VIC AB 1 CT1 27 (SUTURE) ×4
SUT VIC AB 1 CT1 27XBRD ANBCTR (SUTURE) IMPLANT
SUT VIC AB 2-0 CTB1 (SUTURE) ×2 IMPLANT
SUT WIRE 16GA (Orthopedic Implant) ×1 IMPLANT
SYR CONTROL 10ML LL (SYRINGE) IMPLANT
TOWEL OR 17X24 6PK STRL BLUE (TOWEL DISPOSABLE) ×2 IMPLANT
TOWEL OR 17X26 10 PK STRL BLUE (TOWEL DISPOSABLE) ×2 IMPLANT
TUBE CONNECTING 12X1/4 (SUCTIONS) ×2 IMPLANT
UNDERPAD 30X30 INCONTINENT (UNDERPADS AND DIAPERS) ×1 IMPLANT
WATER STERILE IRR 1000ML POUR (IV SOLUTION) IMPLANT
YANKAUER SUCT BULB TIP NO VENT (SUCTIONS) ×2 IMPLANT

## 2012-09-08 NOTE — Anesthesia Preprocedure Evaluation (Addendum)
Anesthesia Evaluation  Patient identified by MRN, date of birth, ID band Patient awake    Reviewed: Allergy & Precautions, H&P , NPO status , Patient's Chart, lab work & pertinent test results  Airway Mallampati: II TM Distance: >3 FB Neck ROM: Full    Dental  (+) Upper Dentures, Dental Advisory Given and Poor Dentition   Pulmonary shortness of breath and with exertion, COPDCurrent Smoker (2ppd),  breath sounds clear to auscultation  Pulmonary exam normal       Cardiovascular negative cardio ROS  Rhythm:Regular Rate:Normal     Neuro/Psych Seizures - (as a child, not medicated since age 52),  Anxiety    GI/Hepatic Neg liver ROS, GERD-  Controlled,(+)     substance abuse  alcohol use,   Endo/Other  negative endocrine ROS  Renal/GU negative Renal ROS     Musculoskeletal   Abdominal Normal abdominal exam  (+)   Peds  Hematology negative hematology ROS (+)   Anesthesia Other Findings   Reproductive/Obstetrics                          Anesthesia Physical Anesthesia Plan  ASA: II  Anesthesia Plan: General   Post-op Pain Management:    Induction: Intravenous  Airway Management Planned: Oral ETT  Additional Equipment:   Intra-op Plan:   Post-operative Plan: Extubation in OR  Informed Consent: I have reviewed the patients History and Physical, chart, labs and discussed the procedure including the risks, benefits and alternatives for the proposed anesthesia with the patient or authorized representative who has indicated his/her understanding and acceptance.   Dental advisory given  Plan Discussed with: Surgeon, Anesthesiologist and CRNA  Anesthesia Plan Comments: (Plan routine monitors, GETA with femoral nerve block for post op analgesia)       Anesthesia Quick Evaluation

## 2012-09-08 NOTE — Brief Op Note (Signed)
09/04/2012 - 09/08/2012  11:34 AM  PATIENT:  [name] [name]  52 y.o. male  PRE-OPERATIVE DIAGNOSIS:  RIGHT  Closed PATELLA fracture  POST-OPERATIVE DIAGNOSIS:  Right closed patella fracture  PROCEDURE:  Procedure(s): OPEN REDUCTION INTERNAL (ORIF) FIXATION RIGHT PATELLA (Right)  SURGEON:  Surgeon(s) and Role:    * [name], MD - Primary  PHYSICIAN ASSISTANT: Lanney Gins, PA-C  ANESTHESIA:   regional and general  EBL:  Total I/O In: 1240 [P.O.:240; I.V.:1000] Out: -   BLOOD ADMINISTERED:none  DRAINS: none   LOCAL MEDICATIONS USED:  NONE  SPECIMEN:  No Specimen  DISPOSITION OF SPECIMEN:  N/A  COUNTS:  YES  TOURNIQUET:   Total Tourniquet Time Documented: Thigh (Right) - 59 minutes Total: Thigh (Right) - 59 minutes   DICTATION: .Other Dictation: Dictation Number [id number]  PLAN OF CARE: Admit to inpatient   PATIENT DISPOSITION:  PACU - hemodynamically stable.   Delay start of Pharmacological VTE agent (>24hrs) due to surgical blood loss or risk of bleeding: no

## 2012-09-08 NOTE — Anesthesia Procedure Notes (Signed)
Anesthesia Regional Block:  Femoral nerve block  Pre-Anesthetic Checklist: ,, timeout performed, Correct Patient, Correct Site, Correct Laterality, Correct Procedure, Correct Position, site marked, Risks and benefits discussed,  Surgical consent,  Pre-op evaluation,  At surgeon's request and post-op pain management  Laterality: Right  Prep: chloraprep       Needles:  Injection technique: Single-shot  Needle Type: Stimulator Needle - 40     Needle Length: 4cm  Needle Gauge: 22 and 22 G    Additional Needles:  Procedures: nerve stimulator Femoral nerve block  Nerve Stimulator or Paresthesia:  Response: patella twitch, 0.4 mA, 0.1 ms,   Additional Responses:   Narrative:  Start time: 09/08/2012 9:24 AM End time: 09/08/2012 9:29 AM Injection made incrementally with aspirations every 5 mL.  Performed by: Personally  Anesthesiologist: Sandford Craze, MD  Additional Notes: Pt identified in Holding room.  Monitors applied. Working IV access confirmed. Sterile prep R groin.  #22ga PNS to patella twitch at 0.15mA threshold.  30cc 0.5% Bupivacaine with 1:200k epi injected incrementally after negative test dose.  Patient asymptomatic, VSS, no heme aspirated, tolerated well.  Sandford Craze, MD

## 2012-09-08 NOTE — Preoperative (Signed)
Beta Blockers   Reason not to administer Beta Blockers:Not Applicable 

## 2012-09-08 NOTE — Progress Notes (Signed)
Pt transfered to ortho post op. Report called to 5N.

## 2012-09-08 NOTE — Transfer of Care (Signed)
Immediate Anesthesia Transfer of Care Note  Patient: [name] [name]  Procedure(s) Performed: Procedure(s): OPEN REDUCTION INTERNAL (ORIF) FIXATION RIGHT PATELLA (Right)  Patient Location: PACU  Anesthesia Type:General and Regional  Level of Consciousness: awake, alert , oriented and patient cooperative  Airway & Oxygen Therapy: Patient Spontanous Breathing and Patient connected to nasal cannula oxygen  Post-op Assessment: Report given to PACU RN and Post -op Vital signs reviewed and stable  Post vital signs: Reviewed and stable  Complications: No apparent anesthesia complications

## 2012-09-08 NOTE — Progress Notes (Signed)
NPO  To OR this am for ORIF of his right patella fracture Consent signed and on chart  Family in waiting room

## 2012-09-08 NOTE — Anesthesia Postprocedure Evaluation (Signed)
  Anesthesia Post-op Note  Patient: [name] [name]  Procedure(s) Performed: Procedure(s): OPEN REDUCTION INTERNAL (ORIF) FIXATION RIGHT PATELLA (Right)  Patient Location: PACU  Anesthesia Type:GA combined with regional for post-op pain  Level of Consciousness: awake, alert , oriented and patient cooperative  Airway and Oxygen Therapy: Patient Spontanous Breathing and Patient connected to nasal cannula oxygen  Post-op Pain: none  Post-op Assessment: Post-op Vital signs reviewed, Patient's Cardiovascular Status Stable, Respiratory Function Stable, Patent Airway, No signs of Nausea or vomiting and Pain level controlled  Post-op Vital Signs: Reviewed and stable  Complications: No apparent anesthesia complications

## 2012-09-09 MED ORDER — BISACODYL 10 MG RE SUPP
10.0000 mg | Freq: Once | RECTAL | Status: AC
  Administered 2012-09-09: 10 mg via RECTAL
  Filled 2012-09-09: qty 1

## 2012-09-09 MED ORDER — SPIRITUS FRUMENTI
1.0000 | Freq: Every day | ORAL | Status: DC
Start: 2012-09-09 — End: 2012-09-10
  Administered 2012-09-09: 1 via ORAL
  Filled 2012-09-09 (×2): qty 1

## 2012-09-09 NOTE — Progress Notes (Addendum)
Subjective: 1 Day Post-Op Procedure(s) (LRB): OPEN REDUCTION INTERNAL (ORIF) FIXATION RIGHT PATELLA (Right) Patient reports pain as 3 on 0-10 scale.    Objective: Vital signs in last 24 hours: Temp:  [97.6 F (36.4 C)-98.6 F (37 C)] 98.6 F (37 C) (03/09 0653) Pulse Rate:  [63-103] 75 (03/09 0653) Resp:  [7-22] 18 (03/09 0653) BP: (113-136)/(66-80) 117/75 mmHg (03/09 0653) SpO2:  [96 %-100 %] 99 % (03/09 0653)  Intake/Output from previous day: 03/08 0701 - 03/09 0700 In: 3220 [P.O.:1320; I.V.:1900] Out: 850 [Urine:850] Intake/Output this shift:     Recent Labs  09/08/12 0925  HGB 14.2    Recent Labs  09/08/12 0925  WBC 6.2  RBC 4.50  HCT 41.2  PLT 140*   No results found for this basename: NA, K, CL, CO2, BUN, CREATININE, GLUCOSE, CALCIUM,  in the last 72 hours No results found for this basename: LABPT, INR,  in the last 72 hours  Incision: dressing C/D/I  Assessment/Plan: 1 Day Post-Op Procedure(s) (LRB): OPEN REDUCTION INTERNAL (ORIF) FIXATION RIGHT PATELLA (Right) Advance diet Up with therapy Decreased sensation right thigh. Motor 5/5 probable tourniq  No DVT. Pulses intact. Compts soft.  BEANE,JEFFREY C 09/09/2012, 9:46 AM

## 2012-09-09 NOTE — Care Management (Signed)
Cm spoke with patient concerning discharge planning. Per pt unemployed, no other source of income Pt states financial advisor has contacted him concerning medicaid application. Pt states will need medication assistance upon discharge for XARELTO. Pt qualifies for [facility] program. Pt informed Cm to provide pt with medication assistance card upon date of discharge. Per pt currently living with sister. Pt states no HH services required upon discharge. PT eval suggest no further follow up. Pt request RW upon discharge. Awaiting MD orders.   Roxy Manns Davis,RN,BSN [phone]

## 2012-09-09 NOTE — Evaluation (Signed)
Occupational Therapy Evaluation Patient Details Name: [name] [name] MRN: [id number] DOB: Aug 07, 1960 Today's Date: 09/09/2012 Time: 2956-2130 OT Time Calculation (min): 31 min  OT Assessment / Plan / Recommendation Clinical Impression  Pt admitted with right patellar fx and is now s/p ORIF.  Pt perfroming ADLs and mobility at supervision level. Has necessary level of assist at home. No further acute OT needs.    OT Assessment  Patient does not need any further OT services    Follow Up Recommendations  No OT follow up;Supervision - Intermittent    Barriers to Discharge      Equipment Recommendations  None recommended by OT    Recommendations for Other Services    Frequency       Precautions / Restrictions Precautions Precautions: Fall Required Braces or Orthoses: Knee Immobilizer - Right Knee Immobilizer - Right: On at all times Restrictions Weight Bearing Restrictions: Yes RLE Weight Bearing: Weight bearing as tolerated   Pertinent Vitals/Pain See vitals    ADL  Eating/Feeding: Performed;Independent Where Assessed - Eating/Feeding: Chair Upper Body Bathing: Simulated;Independent Where Assessed - Upper Body Bathing: Unsupported sitting Lower Body Bathing: Simulated;Supervision/safety Where Assessed - Lower Body Bathing: Unsupported sit to stand Upper Body Dressing: Performed;Independent Where Assessed - Upper Body Dressing: Unsupported sitting Lower Body Dressing: Performed;Supervision/safety Where Assessed - Lower Body Dressing: Unsupported sit to stand Toilet Transfer: Simulated;Supervision/safety Toilet Transfer Method: Sit to Barista:  (bed to chair) Equipment Used: Rolling walker;Knee Immobilizer Transfers/Ambulation Related to ADLs: Pt ambulated in hall with supervision with RW and knee immobilizer on RLE.   ADL Comments: Pt able to don socks long sitting in bed and demonstrated ability to pull boxers up over hips with no LOB while  standing.  Educated to sponge bathe for now due to knee immobilizer on at all times.     OT Diagnosis:    OT Problem List:   OT Treatment Interventions:     OT Goals    Visit Information  Last OT Received On: 09/09/12 Assistance Needed: +1 PT/OT Co-Evaluation/Treatment: Yes    Subjective Data      Prior Functioning     Home Living Lives With: Family Available Help at Discharge: Family;Available PRN/intermittently Type of Home: House Home Access: Level entry Home Layout: One level Bathroom Shower/Tub: Health visitor: Standard Home Adaptive Equipment: Built-in shower seat Prior Function Level of Independence: Independent Able to Take Stairs?: Yes Driving: Yes Communication Communication: No difficulties         Vision/Perception     Cognition  Cognition Overall Cognitive Status: Appears within functional limits for tasks assessed/performed Arousal/Alertness: Awake/alert Orientation Level: Appears intact for tasks assessed Behavior During Session: [facility] for tasks performed    Extremity/Trunk Assessment Right Upper Extremity Assessment RUE ROM/Strength/Tone: Within functional levels Left Upper Extremity Assessment LUE ROM/Strength/Tone: Within functional levels Right Lower Extremity Assessment RLE ROM/Strength/Tone: Deficits RLE ROM/Strength/Tone Deficits: Painful and weak post patellar ORIF.   RLE Sensation: WFL - Light Touch Left Lower Extremity Assessment LLE ROM/Strength/Tone: WFL for tasks assessed LLE Sensation: WFL - Light Touch Trunk Assessment Trunk Assessment: Normal     Mobility Bed Mobility Bed Mobility: Supine to Sit;Sitting - Scoot to Edge of Bed Supine to Sit: 4: Min guard Sitting - Scoot to [place] of Bed: 5: Supervision Details for Bed Mobility Assistance: cues for pt to use L LE under R and UEs to A with moving R LE.   Transfers Transfers: Sit to Stand;Stand to Sit Sit to Stand: 5:  Supervision;With upper extremity  assist;From bed Stand to Sit: 5: Supervision;With upper extremity assist;To chair/3-in-1;With armrests Details for Transfer Assistance: cues for getting closer to bed and positioning of LEs.       Exercise     Balance Balance Balance Assessed: No   End of Session OT - End of Session Equipment Utilized During Treatment:  (RW) Activity Tolerance: Patient tolerated treatment well Patient left: in chair;with call bell/phone within reach Nurse Communication: Mobility status;Patient requests pain meds  GO    09/09/2012 Cipriano Mile OTR/L Pager [phone] Office [phone]  Cipriano Mile 09/09/2012, 12:46 PM

## 2012-09-09 NOTE — Evaluation (Signed)
Physical Therapy Evaluation Patient Details Name: [name] [name] MRN: [id number] DOB: 07/29/1960 Today's Date: 09/09/2012 Time: 1478-2956 PT Time Calculation (min): 29 min  PT Assessment / Plan / Recommendation Clinical Impression  pt presents with R Patellar Fx s/p ORIF.  pt very motivated to improve mobility.  Feel he will be ready to try crutches soon.  pt would benefit from OPPT once MD clears for OPPT and ROM of R knee.      PT Assessment  Patient needs continued PT services    Follow Up Recommendations  No PT follow up    Does the patient have the potential to tolerate intense rehabilitation      Barriers to Discharge None      Equipment Recommendations  Rolling walker with 5" wheels (Pending if pt is successful on crutches.  )    Recommendations for Other Services     Frequency Min 5X/week    Precautions / Restrictions Precautions Precautions: Fall Required Braces or Orthoses: Knee Immobilizer - Right Knee Immobilizer - Right: On at all times Restrictions Weight Bearing Restrictions: Yes RLE Weight Bearing: Weight bearing as tolerated   Pertinent Vitals/Pain Indicates pain as "only a little".        Mobility  Bed Mobility Bed Mobility: Supine to Sit;Sitting - Scoot to Edge of Bed Supine to Sit: 4: Min guard Sitting - Scoot to [place] of Bed: 5: Supervision Details for Bed Mobility Assistance: cues for pt to use L LE under R and UEs to A with moving R LE.   Transfers Transfers: Sit to Stand;Stand to Sit Sit to Stand: 5: Supervision;With upper extremity assist;From bed Stand to Sit: 5: Supervision;With upper extremity assist;To chair/3-in-1;With armrests Details for Transfer Assistance: cues for getting closer to bed and positioning of LEs.   Ambulation/Gait Ambulation/Gait Assistance: 5: Supervision Ambulation Distance (Feet): 350 Feet Assistive device: Rolling walker Ambulation/Gait Assistance Details: cues for upright posture, gait sequencing.   Gait  Pattern: Step-to pattern;Decreased step length - left;Decreased stance time - right Stairs: No Wheelchair Mobility Wheelchair Mobility: No    Exercises     PT Diagnosis: Difficulty walking;Acute pain  PT Problem List: Decreased balance;Decreased activity tolerance;Decreased mobility;Decreased knowledge of use of DME;Pain PT Treatment Interventions: DME instruction;Gait training;Stair training;Functional mobility training;Therapeutic activities;Therapeutic exercise;Balance training;Patient/family education   PT Goals Acute Rehab PT Goals PT Goal Formulation: With patient Time For Goal Achievement: 09/16/12 Potential to Achieve Goals: Good Pt will go Supine/Side to Sit: with modified independence PT Goal: Supine/Side to Sit - Progress: Goal set today Pt will go Sit to Supine/Side: with modified independence PT Goal: Sit to Supine/Side - Progress: Goal set today Pt will go Sit to Stand: with modified independence PT Goal: Sit to Stand - Progress: Goal set today Pt will go Stand to Sit: with modified independence PT Goal: Stand to Sit - Progress: Goal set today Pt will Ambulate: >150 feet;with modified independence;with least restrictive assistive device PT Goal: Ambulate - Progress: Goal set today  Visit Information  Last PT Received On: 09/09/12 Assistance Needed: +1 PT/OT Co-Evaluation/Treatment: Yes    Subjective Data  Subjective: I've gotta keep walking.   Patient Stated Goal: Walk   Prior Functioning  Home Living Lives With: Family Available Help at Discharge: Family;Available PRN/intermittently Type of Home: House Home Access: Level entry Home Layout: One level Bathroom Shower/Tub: Health visitor: Standard Home Adaptive Equipment: Built-in shower seat Prior Function Level of Independence: Independent Able to Take Stairs?: Yes Driving: Yes Communication Communication: No difficulties  Cognition  Cognition Overall Cognitive Status: Appears  within functional limits for tasks assessed/performed Arousal/Alertness: Awake/alert Orientation Level: Appears intact for tasks assessed Behavior During Session: [facility] for tasks performed    Extremity/Trunk Assessment Right Lower Extremity Assessment RLE ROM/Strength/Tone: Deficits RLE ROM/Strength/Tone Deficits: Painful and weak post patellar ORIF.   RLE Sensation: WFL - Light Touch Left Lower Extremity Assessment LLE ROM/Strength/Tone: WFL for tasks assessed LLE Sensation: WFL - Light Touch Trunk Assessment Trunk Assessment: Normal   Balance Balance Balance Assessed: No  End of Session PT - End of Session Equipment Utilized During Treatment: Gait belt;Right knee immobilizer Activity Tolerance: Patient tolerated treatment well Patient left: in chair;with call bell/phone within reach;with nursing in room Nurse Communication: Mobility status  GP     Sunny Schlein, [place] [phone] 09/09/2012, 10:50 AM
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
## 2012-09-10 ENCOUNTER — Encounter (HOSPITAL_COMMUNITY): Payer: Self-pay | Admitting: Orthopedic Surgery

## 2012-09-10 LAB — POCT I-STAT 4, (NA,K, GLUC, HGB,HCT)
Glucose, Bld: 108 mg/dL — ABNORMAL HIGH (ref 70–99)
HCT: 44 % (ref 39.0–52.0)
Hemoglobin: 15 g/dL (ref 13.0–17.0)

## 2012-09-10 NOTE — Progress Notes (Signed)
Patient ID: [name] [name], male   DOB: 1960-10-09, 52 y.o.   MRN: [id number] Subjective: 2 Days Post-Op Procedure(s) (LRB): OPEN REDUCTION INTERNAL (ORIF) FIXATION RIGHT PATELLA (Right)    Patient reports pain as mild with current pain regiment.  No events, ready to go home.  Reviewed procedure and plans  Objective:   VITALS:   Filed Vitals:   09/10/12 0647  BP: 122/70  Pulse: 83  Temp: 99.7 F (37.6 C)  Resp: 18    Neurovascular intact Incision: dressing C/D/I Knee immobilizer in place, reviewed  LABS  Recent Labs  09/08/12 0925  HGB 14.2  HCT 41.2  WBC 6.2  PLT 140*    No results found for this basename: NA, K, BUN, CREATININE, GLUCOSE,  in the last 72 hours  No results found for this basename: LABPT, INR,  in the last 72 hours   Assessment/Plan: 2 Days Post-Op Procedure(s) (LRB): OPEN REDUCTION INTERNAL (ORIF) FIXATION RIGHT PATELLA (Right)   Up with therapy Discharge home with home health RTC 2 weeks Scripts on chart Reviewed importance of maintaining straight leg Call with questions ASA for DVT prophylaxis

## 2012-09-10 NOTE — Progress Notes (Signed)
Physical Therapy Treatment Patient Details Name: [name] [name] MRN: [id number] DOB: 1960/07/28 Today's Date: 09/10/2012 Time: 0750-0809 PT Time Calculation (min): 19 min  PT Assessment / Plan / Recommendation Comments on Treatment Session  Pt presents with R patella fx. Moving well this morning but reports increase in pain today 10/10 prior to activity and 9/10 after activity. Pt states that he does not want crutches upon D/C, feels more comfortable with RW. Pt to benefit from OPPT once cleared by MD. Pt appropriate for acute PT D/C upon medical D/C. Pt states he does not desire HHPT or HH services.     Follow Up Recommendations  No PT follow up     Does the patient have the potential to tolerate intense rehabilitation     Barriers to Discharge        Equipment Recommendations  Rolling walker with 5" wheels    Recommendations for Other Services    Frequency Min 5X/week   Plan Discharge plan remains appropriate;Frequency remains appropriate    Precautions / Restrictions Precautions Precautions: Fall Required Braces or Orthoses: Knee Immobilizer - Right Knee Immobilizer - Right: On at all times Restrictions Weight Bearing Restrictions: Yes RLE Weight Bearing: Weight bearing as tolerated   Pertinent Vitals/Pain 10/10 prior to activity, 9/10 after treatment. Premedicated.    Mobility  Bed Mobility Bed Mobility: Supine to Sit;Sitting - Scoot to Edge of Bed Supine to Sit: 5: Supervision;With rails Sitting - Scoot to Edge of Bed: 5: Supervision Details for Bed Mobility Assistance: used L LE to assist R LE to EOB, used rails for supine to sit Transfers Transfers: Sit to Stand;Stand to Sit Sit to Stand: 5: Supervision;With upper extremity assist;From bed Stand to Sit: With armrests;To bed;5: Supervision Details for Transfer Assistance: minimal verbal cues for safety with RW. Ambulation/Gait Ambulation/Gait Assistance: 5: Supervision Ambulation Distance (Feet): 300  Feet Assistive device: Rolling walker Ambulation/Gait Assistance Details: cues for posture and sequencing Gait Pattern: Step-to pattern;Decreased step length - left;Decreased stance time - right Gait velocity: decreased Stairs: No Wheelchair Mobility Wheelchair Mobility: No    Exercises     PT Diagnosis:    PT Problem List:   PT Treatment Interventions:     PT Goals Acute Rehab PT Goals PT Goal Formulation: With patient Time For Goal Achievement: 09/16/12 Potential to Achieve Goals: Good PT Goal: Supine/Side to Sit - Progress: Progressing toward goal PT Goal: Sit to Supine/Side - Progress: Progressing toward goal PT Goal: Sit to Stand - Progress: Progressing toward goal PT Goal: Stand to Sit - Progress: Progressing toward goal PT Goal: Ambulate - Progress: Progressing toward goal  Visit Information  Last PT Received On: 09/10/12 Assistance Needed: +1    Subjective Data  Subjective: Im going home today. I had a rough night. Pain 10/10    Cognition  Cognition Overall Cognitive Status: Appears within functional limits for tasks assessed/performed Arousal/Alertness: Awake/alert Orientation Level: Appears intact for tasks assessed Behavior During Session: [facility] for tasks performed    Balance  Balance Balance Assessed: No  End of Session PT - End of Session Equipment Utilized During Treatment: Gait belt;Right knee immobilizer Activity Tolerance: Patient tolerated treatment well Patient left: in bed;with call bell/phone within reach Nurse Communication: Mobility status   GP     Donell Sievert, [place] [phone] 09/10/2012, 8:52 AM
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
## 2012-10-06 NOTE — ED Provider Notes (Signed)
History     CSN: [id number]  Arrival date & time 09/04/12  2222   First MD Initiated Contact with Patient 09/04/12 2235      Chief Complaint  Patient presents with  . Knee Injury    (Consider location/radiation/quality/duration/timing/severity/associated sxs/prior treatment) HPI  History reviewed. No pertinent past medical history.  Past Surgical History  Procedure Laterality Date  . Orif patella Right 09/08/2012    Procedure: OPEN REDUCTION INTERNAL (ORIF) FIXATION RIGHT PATELLA;  Surgeon: [name], MD;  Location: [facility] OR;  Service: Orthopedics;  Laterality: Right;    No family history on file.  History  Substance Use Topics  . Smoking status: Not on file  . Smokeless tobacco: Not on file  . Alcohol Use: Yes      Review of Systems  Allergies  Phenobarbital  Home Medications   Current Outpatient Rx  Name  Route  Sig  Dispense  Refill  . cephALEXin (KEFLEX) 500 MG capsule   Oral   Take 500 mg by mouth 2 (two) times daily as needed. Patient just takes it whenever he needs it for a cough per family member         . aspirin EC 325 MG tablet   Oral   Take 1 tablet (325 mg total) by mouth 2 (two) times daily.   60 tablet   0   . docusate sodium 100 MG CAPS   Oral   Take 100 mg by mouth 2 (two) times daily.   10 capsule      . methocarbamol (ROBAXIN) 500 MG tablet   Oral   Take 1 tablet (500 mg total) by mouth every 6 (six) hours as needed (muscle spasms).   50 tablet   0   . oxyCODONE (OXY IR/ROXICODONE) 5 MG immediate release tablet   Oral   Take 1 tablet (5 mg total) by mouth every 4 (four) hours as needed for pain.   100 tablet   0   . polyethylene glycol (MIRALAX / GLYCOLAX) packet   Oral   Take 17 g by mouth 2 (two) times daily.   14 each   0     BP 122/70  Pulse 83  Temp(Src) 99.7 F (37.6 C) (Oral)  Resp 18  Ht 5\' 10"  (1.778 m)  Wt 160 lb 3.2 oz (72.666 kg)  BMI 22.99 kg/m2  SpO2 97%  Physical Exam  ED Course   Procedures (including critical care time)  Labs Reviewed  SURGICAL PCR SCREEN - Abnormal; Notable for the following:    Staphylococcus aureus POSITIVE (*)    All other components within normal limits  CBC - Abnormal; Notable for the following:    Platelets 140 (*)    All other components within normal limits  POCT I-STAT 4, (NA,K, GLUC, HGB,HCT) - Abnormal; Notable for the following:    Glucose, Bld 108 (*)    All other components within normal limits  DIFFERENTIAL   No results found.   1. Patella fracture, right, closed, initial encounter       MDM         Arman Filter, NP 10/06/12 1953

## 2012-10-08 NOTE — ED Provider Notes (Addendum)
Medical screening examination/treatment/procedure(s) were performed by non-physician practitioner and as supervising physician I was immediately available for consultation/collaboration.  Toy Baker, MD 10/08/12 1031  Toy Baker, MD 10/13/12 [phone]

## 2012-10-17 ENCOUNTER — Emergency Department (HOSPITAL_COMMUNITY): Payer: Self-pay

## 2012-10-17 ENCOUNTER — Encounter (HOSPITAL_COMMUNITY): Payer: Self-pay | Admitting: *Deleted

## 2012-10-17 ENCOUNTER — Emergency Department (HOSPITAL_COMMUNITY)
Admission: EM | Admit: 2012-10-17 | Discharge: 2012-10-17 | Disposition: A | Discharge status: Home / Self Care | Payer: Self-pay | Attending: Emergency Medicine | Admitting: Emergency Medicine

## 2012-10-17 DIAGNOSIS — M25069 Hemarthrosis, unspecified knee: Secondary | ICD-10-CM | POA: Insufficient documentation

## 2012-10-17 DIAGNOSIS — Z7982 Long term (current) use of aspirin: Secondary | ICD-10-CM | POA: Insufficient documentation

## 2012-10-17 DIAGNOSIS — S82001P Unspecified fracture of right patella, subsequent encounter for closed fracture with malunion: Secondary | ICD-10-CM

## 2012-10-17 DIAGNOSIS — Y929 Unspecified place or not applicable: Secondary | ICD-10-CM | POA: Insufficient documentation

## 2012-10-17 DIAGNOSIS — M25469 Effusion, unspecified knee: Secondary | ICD-10-CM | POA: Insufficient documentation

## 2012-10-17 DIAGNOSIS — Y939 Activity, unspecified: Secondary | ICD-10-CM | POA: Insufficient documentation

## 2012-10-17 DIAGNOSIS — M25461 Effusion, right knee: Secondary | ICD-10-CM

## 2012-10-17 DIAGNOSIS — S82009A Unspecified fracture of unspecified patella, initial encounter for closed fracture: Secondary | ICD-10-CM | POA: Insufficient documentation

## 2012-10-17 DIAGNOSIS — M25061 Hemarthrosis, right knee: Secondary | ICD-10-CM

## 2012-10-17 DIAGNOSIS — R296 Repeated falls: Secondary | ICD-10-CM | POA: Insufficient documentation

## 2012-10-17 MED ORDER — HYDROMORPHONE HCL PF 1 MG/ML IJ SOLN: 1.0000 mg | Freq: Once | INTRAMUSCULAR | Status: DC

## 2012-10-17 MED ORDER — OXYCODONE-ACETAMINOPHEN 5-325 MG PO TABS: 2.0000 | ORAL_TABLET | ORAL | Status: DC | PRN

## 2012-10-17 MED ORDER — HYDROMORPHONE HCL PF 2 MG/ML IJ SOLN
2.0000 mg | Freq: Once | INTRAMUSCULAR | Status: AC
  Administered 2012-10-17: 2 mg via INTRAVENOUS

## 2012-10-17 MED ORDER — HYDROMORPHONE HCL PF 2 MG/ML IJ SOLN
2.0000 mg | Freq: Once | INTRAMUSCULAR | Status: AC
  Administered 2012-10-17: 2 mg via INTRAMUSCULAR
  Filled 2012-10-17: qty 1

## 2012-10-17 MED ORDER — HYDROMORPHONE HCL PF 2 MG/ML IJ SOLN
2.0000 mg | Freq: Once | INTRAMUSCULAR | Status: DC
  Filled 2012-10-17: qty 1

## 2012-10-17 MED ORDER — FENTANYL CITRATE 0.05 MG/ML IJ SOLN
50.0000 ug | Freq: Once | INTRAMUSCULAR | Status: AC
  Administered 2012-10-17: 50 ug via INTRAVENOUS
  Filled 2012-10-17: qty 2

## 2012-10-17 NOTE — ED Notes (Signed)
MD at bedside to drain knee

## 2012-10-17 NOTE — ED Notes (Signed)
Per pt sts right knee pain and swelling. Recent surgery. sts a dog wrapped its chain around his leg.

## 2012-10-17 NOTE — ED Notes (Signed)
Pt transported to radiology.

## 2012-10-17 NOTE — ED Provider Notes (Addendum)
I saw and evaluated the patient, reviewed the resident's note and I agree with the findings and plan.  I supervised the resident during the arthrocentesis of the knee and was present/available for key portions of the procedure. Rolan Bucco, MD 10/17/12 2356  Rolan Bucco, MD 10/30/12 [phone]

## 2012-10-17 NOTE — ED Provider Notes (Signed)
PT with ORIF patella by Dr Charlann Boxer one month ago, here with knee injury.  Leg got caught in dog chain and he came down on knee.  Happened today.  Marked swelling/effusion present.  NV intact.  Rolan Bucco, MD 10/17/12 [phone]

## 2012-10-17 NOTE — ED Provider Notes (Signed)
History     CSN: [id number]  Arrival date & time 10/17/12  1655   First MD Initiated Contact with Patient 10/17/12 1734      Chief Complaint  Patient presents with  . Knee Injury    (Consider location/radiation/quality/duration/timing/severity/associated sxs/prior treatment) Patient is a 52 y.o. male presenting with knee pain. The history is provided by the patient.  Knee Pain Location:  Knee (right patella) Time since incident:  2 hours Injury: yes   Mechanism of injury comment:  His dog's chain wrapped around his knee and pulled Knee location:  R knee Pain details:    Quality:  Aching and dull   Radiates to:  Does not radiate   Severity:  Severe   Onset quality:  Sudden   Duration:  2 hours   Timing:  Constant   Progression:  Unchanged Chronicity:  New Dislocation: no   Foreign body present:  No foreign bodies Prior injury to area:  Yes (ORIF 3/8 after patellar fracture) Associated symptoms: no fever and no neck pain     History reviewed. No pertinent past medical history.  Past Surgical History  Procedure Laterality Date  . Orif patella Right 09/08/2012    Procedure: OPEN REDUCTION INTERNAL (ORIF) FIXATION RIGHT PATELLA;  Surgeon: [name], MD;  Location: [facility] OR;  Service: Orthopedics;  Laterality: Right;    History reviewed. No pertinent family history.  History  Substance Use Topics  . Smoking status: Not on file  . Smokeless tobacco: Not on file  . Alcohol Use: Yes      Review of Systems  Constitutional: Negative for fever, chills, activity change and appetite change.  HENT: Negative for neck pain.   Respiratory: Negative for cough, chest tightness, shortness of breath and wheezing.   Cardiovascular: Negative for chest pain and palpitations.  Gastrointestinal: Negative for nausea, vomiting, abdominal pain, diarrhea and constipation.  Musculoskeletal: Positive for arthralgias (overlying right knee) and gait problem (since ORIF, ambulating with  walker).  Skin: Negative for rash and wound.  Neurological: Negative for weakness, light-headedness and numbness.  All other systems reviewed and are negative.    Allergies  Phenobarbital  Home Medications   Current Outpatient Rx  Name  Route  Sig  Dispense  Refill  . aspirin EC 325 MG tablet   Oral   Take 1 tablet (325 mg total) by mouth 2 (two) times daily.   60 tablet   0   . cephALEXin (KEFLEX) 500 MG capsule   Oral   Take 500 mg by mouth 2 (two) times daily as needed. Patient just takes it whenever he needs it for a cough per family member         . docusate sodium 100 MG CAPS   Oral   Take 100 mg by mouth 2 (two) times daily.   10 capsule      . methocarbamol (ROBAXIN) 500 MG tablet   Oral   Take 1 tablet (500 mg total) by mouth every 6 (six) hours as needed (muscle spasms).   50 tablet   0   . oxyCODONE (OXY IR/ROXICODONE) 5 MG immediate release tablet   Oral   Take 1 tablet (5 mg total) by mouth every 4 (four) hours as needed for pain.   100 tablet   0   . polyethylene glycol (MIRALAX / GLYCOLAX) packet   Oral   Take 17 g by mouth 2 (two) times daily.   14 each   0  BP 151/119  Pulse 78  Temp(Src) 97.8 F (36.6 C) (Oral)  Resp 24  SpO2 100%  Physical Exam  Nursing note and vitals reviewed. Constitutional: He appears well-developed and well-nourished.  HENT:  Head: Normocephalic and atraumatic.  Right Ear: External ear normal.  Left Ear: External ear normal.  Nose: Nose normal.  Mouth/Throat: Oropharynx is clear and moist. No oropharyngeal exudate.  Eyes: Conjunctivae are normal. Pupils are equal, round, and reactive to light.  Neck: Normal range of motion. Neck supple.  Cardiovascular: Normal rate, regular rhythm, normal heart sounds and intact distal pulses.   Pulmonary/Chest: Effort normal and breath sounds normal. No respiratory distress. He has no wheezes. He has no rales. He exhibits no tenderness.  Abdominal: Soft. Bowel sounds  are normal. He exhibits no distension and no mass. There is no tenderness. There is no rebound and no guarding.  Musculoskeletal: He exhibits edema (large amount overlying patella ) and tenderness (moderate amount overlying patella).  Neurological: He is alert. He displays normal reflexes. No cranial nerve deficit. He exhibits normal muscle tone. Coordination normal.  Skin: Skin is warm and dry. No rash noted. No erythema. No pallor.  Psychiatric: He has a normal mood and affect. His behavior is normal. Judgment and thought content normal.    ED Course  ARTHOCENTESIS Date/Time: 10/17/2012 9:59 PM Performed by: Clemetine Marker Authorized by: Rolan Bucco Consent: written consent obtained. Risks and benefits: risks, benefits and alternatives were discussed Consent given by: patient Patient understanding: patient states understanding of the procedure being performed Patient consent: the patient's understanding of the procedure matches consent given Procedure consent: procedure consent matches procedure scheduled Relevant documents: relevant documents present and verified Test results: test results available and properly labeled Site marked: the operative site was marked Imaging studies: imaging studies available Patient identity confirmed: verbally with patient, arm band and hospital-assigned identification number Time out: Immediately prior to procedure a "time out" was called to verify the correct patient, procedure, equipment, support staff and site/side marked as required. Indications: joint swelling and pain  Body area: knee Joint: right knee Local anesthesia used: yes Anesthesia: local infiltration Local anesthetic: lidocaine 2% with epinephrine Anesthetic total: 2 ml Patient sedated: no Preparation: Patient was prepped and draped in the usual sterile fashion. Needle gauge: 22 G Approach: medial Aspirate: bloody Aspirate amount: 20 ml Patient tolerance: Patient tolerated the  procedure well with no immediate complications.   (including critical care time)  Labs Reviewed - No data to display Dg Knee Complete 4 Views Right  10/17/2012  *RADIOLOGY REPORT*  Clinical Data: Right knee pain and swelling secondary to a fall today. Patellar fracture on 09/04/2012.  RIGHT KNEE - COMPLETE 4+ VIEW  Comparison: Radiographs dated 09/04/2012 and 06/08/2006  Findings: The patellar fracture has not yet healed.  There is amorphous calcification in the region of the patellar tendon 2 cm below the lower pole of the patella.  This may represent dystrophic calcification secondary to the prior injury.  There is marked soft tissue swelling over the distal quadriceps tendon, patella, and patellar tendon.  There is a knee joint effusion.  The femur, tibia, and fibula appear normal at the knee joint.  IMPRESSION:  1.  Marked soft tissue swelling anteriorly. 2.  The patella fracture has not yet healed. 3.  Dystrophic calcification in the region of the patellar tendon is probably associated with the prior injury.   Original Report Authenticated By: Francene Boyers, M.D.      1. Knee effusion, right  2. Hemarthrosis of right knee   3. Patellar fracture, right, closed, with malunion, subsequent encounter       MDM  52 yo M w/hx of right patellar ORIF (3/8) after patellar fracture presents for worsened pain at patella since his dog's chain got wrapped around his leg. Patella with large amount of edema and trace ecchymosis. Injury is closed. NV intact distally. Analgesia administered. Imaging reveals no new fracture, displacement of hardware, or displacement of previous fracture. Patient still in significant pain despite IV analgesia; suspect hemarthrosis clinically. I spoke with [facility] on-call Orthopedic Surgeon regarding arthrocentesis to relieve pain, and he agreed to plan. Pt consented and arthrocentesis performed (see procedure note) with 20 mL of dark blood aspirated with mild  relief of pain. Unfortunately, no further fluid aspirated. Knee immobilizer placed and pt counseled on rest, ice, compression, and elevation. Patient given prescription for pain medication and return precautions, including worsening of signs or symptoms. Patient instructed to follow-up with orthopedic surgery.          Clemetine Marker, MD 10/17/12 2300

## 2012-11-09 ENCOUNTER — Encounter (HOSPITAL_BASED_OUTPATIENT_CLINIC_OR_DEPARTMENT_OTHER): Payer: Self-pay | Admitting: *Deleted

## 2012-11-09 ENCOUNTER — Emergency Department (HOSPITAL_BASED_OUTPATIENT_CLINIC_OR_DEPARTMENT_OTHER)
Admission: EM | Admit: 2012-11-09 | Discharge: 2012-11-10 | Disposition: A | Discharge status: Home / Self Care | Payer: Self-pay | Attending: Emergency Medicine | Admitting: Emergency Medicine

## 2012-11-09 ENCOUNTER — Emergency Department (HOSPITAL_BASED_OUTPATIENT_CLINIC_OR_DEPARTMENT_OTHER): Payer: Self-pay

## 2012-11-09 DIAGNOSIS — F17200 Nicotine dependence, unspecified, uncomplicated: Secondary | ICD-10-CM | POA: Insufficient documentation

## 2012-11-09 DIAGNOSIS — M25569 Pain in unspecified knee: Secondary | ICD-10-CM | POA: Insufficient documentation

## 2012-11-09 DIAGNOSIS — Z7982 Long term (current) use of aspirin: Secondary | ICD-10-CM | POA: Insufficient documentation

## 2012-11-09 DIAGNOSIS — G8918 Other acute postprocedural pain: Secondary | ICD-10-CM | POA: Insufficient documentation

## 2012-11-09 DIAGNOSIS — M25561 Pain in right knee: Secondary | ICD-10-CM

## 2012-11-09 NOTE — ED Notes (Signed)
Pt had surgery to repair knee cap in April and has continued pain, redness, and swelling to right knee. +ETOH

## 2012-11-09 NOTE — ED Notes (Signed)
Pt refused w/c or ice pack at this time.

## 2012-11-09 NOTE — ED Notes (Signed)
Patient transported to X-ray 

## 2012-11-10 MED ORDER — SULFAMETHOXAZOLE-TRIMETHOPRIM 800-160 MG PO TABS: 1.0000 | ORAL_TABLET | Freq: Two times a day (BID) | ORAL | Status: DC

## 2012-11-10 MED ORDER — KETOROLAC TROMETHAMINE 60 MG/2ML IM SOLN
60.0000 mg | Freq: Once | INTRAMUSCULAR | Status: AC
  Administered 2012-11-10: 60 mg via INTRAMUSCULAR
  Filled 2012-11-10: qty 2

## 2012-11-10 MED ORDER — MELOXICAM 7.5 MG PO TABS: 7.5000 mg | ORAL_TABLET | Freq: Every day | ORAL | Status: DC
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
## 2012-11-14 ENCOUNTER — Encounter (HOSPITAL_COMMUNITY): Payer: Self-pay | Admitting: *Deleted

## 2012-11-15 NOTE — Progress Notes (Signed)
Patient aware of surgery date change.  New instructions given per phone-  No food after midnight Tuesday night, clear liquids until 0630 Wednesday AM, then nothing by mouth.  Verbalizes understanding. Also discussed betasept shower date chang.   Per Clydie Braun at Dr Boneta Lucks,  OK for same day labs
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
## 2012-11-21 ENCOUNTER — Encounter (HOSPITAL_COMMUNITY): Payer: Self-pay | Admitting: *Deleted

## 2012-11-21 ENCOUNTER — Encounter (HOSPITAL_COMMUNITY)
Admission: RE | Disposition: A | Discharge status: Home Health | Payer: Self-pay | Source: Ambulatory Visit | Attending: Orthopedic Surgery

## 2012-11-21 ENCOUNTER — Inpatient Hospital Stay (HOSPITAL_COMMUNITY)
Admission: RE | Admit: 2012-11-21 | Discharge: 2012-11-22 | DRG: 497 | Disposition: A | Discharge status: Home Health | Payer: MEDICAID | Source: Ambulatory Visit | Attending: Orthopedic Surgery | Admitting: Orthopedic Surgery

## 2012-11-21 ENCOUNTER — Ambulatory Visit (HOSPITAL_COMMUNITY): Payer: Self-pay | Admitting: Anesthesiology

## 2012-11-21 ENCOUNTER — Encounter (HOSPITAL_COMMUNITY): Payer: Self-pay | Admitting: Anesthesiology

## 2012-11-21 DIAGNOSIS — S86819A Strain of other muscle(s) and tendon(s) at lower leg level, unspecified leg, initial encounter: Secondary | ICD-10-CM

## 2012-11-21 DIAGNOSIS — S86811D Strain of other muscle(s) and tendon(s) at lower leg level, right leg, subsequent encounter: Secondary | ICD-10-CM

## 2012-11-21 DIAGNOSIS — S838X9A Sprain of other specified parts of unspecified knee, initial encounter: Principal | ICD-10-CM | POA: Diagnosis present

## 2012-11-21 DIAGNOSIS — Z9889 Other specified postprocedural states: Secondary | ICD-10-CM

## 2012-11-21 DIAGNOSIS — F17200 Nicotine dependence, unspecified, uncomplicated: Secondary | ICD-10-CM | POA: Diagnosis present

## 2012-11-21 DIAGNOSIS — M2390 Unspecified internal derangement of unspecified knee: Secondary | ICD-10-CM | POA: Diagnosis present

## 2012-11-21 DIAGNOSIS — Z791 Long term (current) use of non-steroidal anti-inflammatories (NSAID): Secondary | ICD-10-CM

## 2012-11-21 HISTORY — PX: PATELLAR TENDON REPAIR: SHX737

## 2012-11-21 HISTORY — DX: Personal history of other medical treatment: Z92.89

## 2012-11-21 LAB — BASIC METABOLIC PANEL
BUN: 10 mg/dL (ref 6–23)
Calcium: 9.6 mg/dL (ref 8.4–10.5)
Creatinine, Ser: 0.92 mg/dL (ref 0.50–1.35)
GFR calc non Af Amer: >90 mL/min (ref >90)
Glucose, Bld: 102 mg/dL — ABNORMAL HIGH (ref 70–99)

## 2012-11-21 LAB — URINALYSIS, ROUTINE W REFLEX MICROSCOPIC
Bilirubin Urine: NEGATIVE
Hgb urine dipstick: NEGATIVE
Specific Gravity, Urine: 1.024 (ref 1.005–1.030)
pH: 5.5 (ref 5.0–8.0)

## 2012-11-21 LAB — CBC
HCT: 45.5 % (ref 39.0–52.0)
Hemoglobin: 15.5 g/dL (ref 13.0–17.0)
MCH: 31.1 pg (ref 26.0–34.0)
MCHC: 34.1 g/dL (ref 30.0–36.0)

## 2012-11-21 LAB — TYPE AND SCREEN: Antibody Screen: NEGATIVE

## 2012-11-21 LAB — SURGICAL PCR SCREEN: MRSA, PCR: NEGATIVE

## 2012-11-21 LAB — ABO/RH: ABO/RH(D): A POS

## 2012-11-21 SURGERY — REPAIR, TENDON, PATELLAR
Anesthesia: General | Site: Knee | Laterality: Right | Wound class: Clean

## 2012-11-21 MED ORDER — OXYCODONE HCL 5 MG PO TABS
5.0000 mg | ORAL_TABLET | ORAL | Status: DC
  Administered 2012-11-21 – 2012-11-22 (×6): 15 mg via ORAL
  Filled 2012-11-21 (×3): qty 3
  Filled 2012-11-21: qty 1
  Filled 2012-11-21: qty 2
  Filled 2012-11-21 (×2): qty 3

## 2012-11-21 MED ORDER — ONDANSETRON HCL 4 MG/2ML IJ SOLN: 4.0000 mg | Freq: Four times a day (QID) | INTRAMUSCULAR | Status: DC | PRN

## 2012-11-21 MED ORDER — CHLORHEXIDINE GLUCONATE 4 % EX LIQD: 60.0000 mL | Freq: Once | CUTANEOUS | Status: DC

## 2012-11-21 MED ORDER — ONDANSETRON HCL 4 MG/2ML IJ SOLN
INTRAMUSCULAR | Status: DC | PRN
  Administered 2012-11-21: 4 mg via INTRAVENOUS

## 2012-11-21 MED ORDER — ONDANSETRON HCL 4 MG PO TABS: 4.0000 mg | ORAL_TABLET | Freq: Four times a day (QID) | ORAL | Status: DC | PRN

## 2012-11-21 MED ORDER — METHOCARBAMOL 100 MG/ML IJ SOLN
500.0000 mg | Freq: Four times a day (QID) | INTRAVENOUS | Status: DC | PRN
  Administered 2012-11-21: 500 mg via INTRAVENOUS
  Filled 2012-11-21: qty 5

## 2012-11-21 MED ORDER — KETAMINE HCL 50 MG/ML IJ SOLN
INTRAMUSCULAR | Status: DC | PRN
  Administered 2012-11-21: 50 mg via INTRAMUSCULAR

## 2012-11-21 MED ORDER — POLYETHYLENE GLYCOL 3350 17 G PO PACK
17.0000 g | PACK | Freq: Two times a day (BID) | ORAL | Status: DC
  Administered 2012-11-21 – 2012-11-22 (×2): 17 g via ORAL

## 2012-11-21 MED ORDER — PROMETHAZINE HCL 25 MG/ML IJ SOLN: 6.2500 mg | INTRAMUSCULAR | Status: DC | PRN

## 2012-11-21 MED ORDER — LACTATED RINGERS IV SOLN: INTRAVENOUS | Status: DC

## 2012-11-21 MED ORDER — SODIUM CHLORIDE 0.9 % IV SOLN
INTRAVENOUS | Status: DC
  Administered 2012-11-21 – 2012-11-22 (×2): via INTRAVENOUS
  Filled 2012-11-21 (×4): qty 1000

## 2012-11-21 MED ORDER — DEXAMETHASONE SODIUM PHOSPHATE 10 MG/ML IJ SOLN
10.0000 mg | Freq: Once | INTRAMUSCULAR | Status: AC
  Administered 2012-11-21: 10 mg via INTRAVENOUS

## 2012-11-21 MED ORDER — FLEET ENEMA 7-19 GM/118ML RE ENEM: 1.0000 | ENEMA | Freq: Once | RECTAL | Status: AC | PRN

## 2012-11-21 MED ORDER — METHOCARBAMOL 500 MG PO TABS
500.0000 mg | ORAL_TABLET | Freq: Four times a day (QID) | ORAL | Status: DC | PRN
  Administered 2012-11-22: 500 mg via ORAL
  Filled 2012-11-21 (×2): qty 1

## 2012-11-21 MED ORDER — LIDOCAINE HCL (CARDIAC) 10 MG/ML IV SOLN
INTRAVENOUS | Status: DC | PRN
  Administered 2012-11-21: 100 mg via INTRAVENOUS

## 2012-11-21 MED ORDER — DOCUSATE SODIUM 100 MG PO CAPS
100.0000 mg | ORAL_CAPSULE | Freq: Two times a day (BID) | ORAL | Status: DC
  Administered 2012-11-21 – 2012-11-22 (×2): 100 mg via ORAL

## 2012-11-21 MED ORDER — ZOLPIDEM TARTRATE 5 MG PO TABS: 5.0000 mg | ORAL_TABLET | Freq: Every evening | ORAL | Status: DC | PRN

## 2012-11-21 MED ORDER — FERROUS SULFATE 325 (65 FE) MG PO TABS
325.0000 mg | ORAL_TABLET | Freq: Three times a day (TID) | ORAL | Status: DC
  Administered 2012-11-21 – 2012-11-22 (×2): 325 mg via ORAL
  Filled 2012-11-21 (×5): qty 1

## 2012-11-21 MED ORDER — CEFAZOLIN SODIUM-DEXTROSE 2-3 GM-% IV SOLR
2.0000 g | Freq: Four times a day (QID) | INTRAVENOUS | Status: AC
  Administered 2012-11-21 – 2012-11-22 (×2): 2 g via INTRAVENOUS
  Filled 2012-11-21 (×2): qty 50

## 2012-11-21 MED ORDER — HYDROMORPHONE HCL PF 1 MG/ML IJ SOLN
INTRAMUSCULAR | Status: DC | PRN
  Administered 2012-11-21 (×2): 1 mg via INTRAVENOUS

## 2012-11-21 MED ORDER — PHENOL 1.4 % MT LIQD
1.0000 | OROMUCOSAL | Status: DC | PRN
  Filled 2012-11-21: qty 177

## 2012-11-21 MED ORDER — DEXAMETHASONE SODIUM PHOSPHATE 10 MG/ML IJ SOLN
10.0000 mg | Freq: Once | INTRAMUSCULAR | Status: DC
  Filled 2012-11-21: qty 1

## 2012-11-21 MED ORDER — MUPIROCIN 2 % EX OINT
TOPICAL_OINTMENT | Freq: Two times a day (BID) | CUTANEOUS | Status: DC
  Administered 2012-11-21: 11:00:00 via NASAL
  Filled 2012-11-21: qty 22

## 2012-11-21 MED ORDER — 0.9 % SODIUM CHLORIDE (POUR BTL) OPTIME
TOPICAL | Status: DC | PRN
  Administered 2012-11-21: 1000 mL

## 2012-11-21 MED ORDER — ASPIRIN EC 325 MG PO TBEC
325.0000 mg | DELAYED_RELEASE_TABLET | Freq: Two times a day (BID) | ORAL | Status: DC
  Administered 2012-11-22: 325 mg via ORAL
  Filled 2012-11-21 (×3): qty 1

## 2012-11-21 MED ORDER — HYDROMORPHONE HCL PF 1 MG/ML IJ SOLN
0.5000 mg | INTRAMUSCULAR | Status: DC | PRN
  Administered 2012-11-21: 1 mg via INTRAVENOUS
  Administered 2012-11-22: 2 mg via INTRAVENOUS
  Administered 2012-11-22: 1 mg via INTRAVENOUS
  Filled 2012-11-21: qty 1
  Filled 2012-11-21: qty 2
  Filled 2012-11-21: qty 1

## 2012-11-21 MED ORDER — DIPHENHYDRAMINE HCL 25 MG PO CAPS: 25.0000 mg | ORAL_CAPSULE | Freq: Four times a day (QID) | ORAL | Status: DC | PRN

## 2012-11-21 MED ORDER — HYDROMORPHONE HCL PF 1 MG/ML IJ SOLN
0.2500 mg | INTRAMUSCULAR | Status: DC | PRN
  Administered 2012-11-21 (×4): 0.5 mg via INTRAVENOUS

## 2012-11-21 MED ORDER — METOCLOPRAMIDE HCL 10 MG PO TABS: 5.0000 mg | ORAL_TABLET | Freq: Three times a day (TID) | ORAL | Status: DC | PRN

## 2012-11-21 MED ORDER — METOCLOPRAMIDE HCL 5 MG/ML IJ SOLN: 5.0000 mg | Freq: Three times a day (TID) | INTRAMUSCULAR | Status: DC | PRN

## 2012-11-21 MED ORDER — ALBUTEROL SULFATE (5 MG/ML) 0.5% IN NEBU
2.5000 mg | INHALATION_SOLUTION | Freq: Once | RESPIRATORY_TRACT | Status: AC
  Administered 2012-11-21: 2.5 mg via RESPIRATORY_TRACT

## 2012-11-21 MED ORDER — PROPOFOL 10 MG/ML IV BOLUS
INTRAVENOUS | Status: DC | PRN
  Administered 2012-11-21: 250 mg via INTRAVENOUS

## 2012-11-21 MED ORDER — SPIRITUS FRUMENTI
2.0000 | Freq: Every day | ORAL | Status: DC
  Administered 2012-11-21: 1 via ORAL
  Filled 2012-11-21 (×2): qty 2

## 2012-11-21 MED ORDER — CELECOXIB 200 MG PO CAPS
200.0000 mg | ORAL_CAPSULE | Freq: Two times a day (BID) | ORAL | Status: DC
  Administered 2012-11-21 – 2012-11-22 (×2): 200 mg via ORAL
  Filled 2012-11-21 (×3): qty 1

## 2012-11-21 MED ORDER — LACTATED RINGERS IV SOLN
INTRAVENOUS | Status: DC
  Administered 2012-11-21: 16:00:00 via INTRAVENOUS
  Administered 2012-11-21: 1000 mL via INTRAVENOUS

## 2012-11-21 MED ORDER — BISACODYL 10 MG RE SUPP: 10.0000 mg | Freq: Every day | RECTAL | Status: DC | PRN

## 2012-11-21 MED ORDER — ALUM & MAG HYDROXIDE-SIMETH 200-200-20 MG/5ML PO SUSP: 30.0000 mL | ORAL | Status: DC | PRN

## 2012-11-21 MED ORDER — SUFENTANIL CITRATE 50 MCG/ML IV SOLN
INTRAVENOUS | Status: DC | PRN
  Administered 2012-11-21: 5 ug via INTRAVENOUS
  Administered 2012-11-21: 10 ug via INTRAVENOUS
  Administered 2012-11-21 (×2): 5 ug via INTRAVENOUS
  Administered 2012-11-21: 10 ug via INTRAVENOUS
  Administered 2012-11-21 (×3): 5 ug via INTRAVENOUS

## 2012-11-21 MED ORDER — MIDAZOLAM HCL 10 MG/2ML IJ SOLN
1.0000 mg | INTRAMUSCULAR | Status: DC | PRN
  Administered 2012-11-21: 2 mg via INTRAVENOUS

## 2012-11-21 MED ORDER — MENTHOL 3 MG MT LOZG
1.0000 | LOZENGE | OROMUCOSAL | Status: DC | PRN
  Filled 2012-11-21: qty 9

## 2012-11-21 MED ORDER — BUPIVACAINE-EPINEPHRINE PF 0.25-1:200000 % IJ SOLN
INTRAMUSCULAR | Status: DC | PRN
  Administered 2012-11-21: 30 mL

## 2012-11-21 MED ORDER — CEFAZOLIN SODIUM-DEXTROSE 2-3 GM-% IV SOLR
2.0000 g | INTRAVENOUS | Status: AC
  Administered 2012-11-21: 2 g via INTRAVENOUS

## 2012-11-21 SURGICAL SUPPLY — 63 items
ADH SKN CLS APL DERMABOND .7 (GAUZE/BANDAGES/DRESSINGS)
BAG SPEC THK2 15X12 ZIP CLS (MISCELLANEOUS)
BAG ZIPLOCK 12X15 (MISCELLANEOUS) ×1 IMPLANT
BANDAGE ELASTIC 6 VELCRO ST LF (GAUZE/BANDAGES/DRESSINGS) ×2 IMPLANT
BANDAGE ESMARK 6X9 LF (GAUZE/BANDAGES/DRESSINGS) ×1 IMPLANT
BANDAGE GAUZE ELAST BULKY 4 IN (GAUZE/BANDAGES/DRESSINGS) ×2 IMPLANT
BIT DRILL 2.4X128 (BIT) ×3 IMPLANT
BNDG CMPR 9X6 STRL LF SNTH (GAUZE/BANDAGES/DRESSINGS) ×1
BNDG ESMARK 6X9 LF (GAUZE/BANDAGES/DRESSINGS) ×2
CLEANER TIP ELECTROSURG 2X2 (MISCELLANEOUS) ×1 IMPLANT
CLOTH BEACON ORANGE TIMEOUT ST (SAFETY) ×2 IMPLANT
CUFF TOURN SGL QUICK 34 (TOURNIQUET CUFF) ×2
CUFF TRNQT CYL 34X4X40X1 (TOURNIQUET CUFF) ×1 IMPLANT
DERMABOND ADVANCED (GAUZE/BANDAGES/DRESSINGS)
DERMABOND ADVANCED .7 DNX12 (GAUZE/BANDAGES/DRESSINGS) ×1 IMPLANT
DRAPE EXTREMITY T 121X128X90 (DRAPE) ×1 IMPLANT
DRSG AQUACEL AG ADV 3.5X10 (GAUZE/BANDAGES/DRESSINGS) ×1 IMPLANT
DRSG PAD ABDOMINAL 8X10 ST (GAUZE/BANDAGES/DRESSINGS) ×2 IMPLANT
DRSG TEGADERM 4X4.75 (GAUZE/BANDAGES/DRESSINGS) ×2 IMPLANT
DURAPREP 26ML APPLICATOR (WOUND CARE) ×2 IMPLANT
ELECT REM PT RETURN 9FT ADLT (ELECTROSURGICAL) ×2
ELECTRODE REM PT RTRN 9FT ADLT (ELECTROSURGICAL) ×1 IMPLANT
EVACUATOR 1/8 PVC DRAIN (DRAIN) ×1 IMPLANT
FACESHIELD LNG OPTICON STERILE (SAFETY) ×4 IMPLANT
GAUZE SPONGE 2X2 8PLY STRL LF (GAUZE/BANDAGES/DRESSINGS) IMPLANT
GAUZE XEROFORM 5X9 LF (GAUZE/BANDAGES/DRESSINGS) ×2 IMPLANT
GLOVE BIOGEL PI IND STRL 7.5 (GLOVE) ×1 IMPLANT
GLOVE BIOGEL PI IND STRL 8 (GLOVE) ×1 IMPLANT
GLOVE BIOGEL PI INDICATOR 7.5 (GLOVE) ×2
GLOVE BIOGEL PI INDICATOR 8 (GLOVE) ×1
GLOVE ECLIPSE 8.0 STRL XLNG CF (GLOVE) ×1 IMPLANT
GLOVE ORTHO TXT STRL SZ7.5 (GLOVE) ×5 IMPLANT
GLOVE SURG SS PI 7.5 STRL IVOR (GLOVE) ×6 IMPLANT
GOWN BRE IMP PREV XXLGXLNG (GOWN DISPOSABLE) ×2 IMPLANT
GOWN PREVENTION PLUS XXLARGE (GOWN DISPOSABLE) ×2 IMPLANT
GOWN STRL NON-REIN LRG LVL3 (GOWN DISPOSABLE) ×3 IMPLANT
IMMOBILIZER KNEE 20 (SOFTGOODS) ×2
IMMOBILIZER KNEE 20 THIGH 36 (SOFTGOODS) IMPLANT
MANIFOLD NEPTUNE II (INSTRUMENTS) ×2 IMPLANT
NEEDLE HYPO 22GX1.5 SAFETY (NEEDLE) ×1 IMPLANT
NS IRRIG 1000ML POUR BTL (IV SOLUTION) ×2 IMPLANT
PACK TOTAL JOINT (CUSTOM PROCEDURE TRAY) ×2 IMPLANT
PASSER SUT SWANSON 36MM LOOP (INSTRUMENTS) ×2 IMPLANT
POSITIONER SURGICAL ARM (MISCELLANEOUS) ×2 IMPLANT
SPONGE GAUZE 2X2 STER 10/PKG (GAUZE/BANDAGES/DRESSINGS) ×1
SPONGE GAUZE 4X4 12PLY (GAUZE/BANDAGES/DRESSINGS) ×2 IMPLANT
STAPLER VISISTAT 35W (STAPLE) ×1 IMPLANT
SUT ETHIBOND NAB CT1 #1 30IN (SUTURE) ×2 IMPLANT
SUT ETHILON 2 0 PSLX (SUTURE) ×1 IMPLANT
SUT FIBERWIRE 2-0 18 17.9 3/8 (SUTURE) ×4
SUT MNCRL AB 4-0 PS2 18 (SUTURE) ×2 IMPLANT
SUT VIC AB 0 CT1 27 (SUTURE) ×4
SUT VIC AB 0 CT1 27XBRD ANTBC (SUTURE) ×2 IMPLANT
SUT VIC AB 1 CT1 27 (SUTURE) ×8
SUT VIC AB 1 CT1 27XBRD ANTBC (SUTURE) IMPLANT
SUT VIC AB 2-0 CT1 27 (SUTURE) ×4
SUT VIC AB 2-0 CT1 TAPERPNT 27 (SUTURE) ×2 IMPLANT
SUTURE FIBERWR 2-0 18 17.9 3/8 (SUTURE) ×2 IMPLANT
SYR CONTROL 10ML LL (SYRINGE) ×1 IMPLANT
TOWEL OR 17X26 10 PK STRL BLUE (TOWEL DISPOSABLE) ×4 IMPLANT
TOWEL OR NON WOVEN STRL DISP B (DISPOSABLE) ×2 IMPLANT
WATER STERILE IRR 1500ML POUR (IV SOLUTION) ×1 IMPLANT
WRAP KNEE MAXI GEL POST OP (GAUZE/BANDAGES/DRESSINGS) ×1 IMPLANT

## 2012-11-21 NOTE — Anesthesia Preprocedure Evaluation (Addendum)
Anesthesia Evaluation  Patient identified by MRN, date of birth, ID band Patient awake    Reviewed: Allergy & Precautions, H&P , NPO status , Patient's Chart, lab work & pertinent test results  Airway Mallampati: II TM Distance: >3 FB Neck ROM: Full    Dental  (+) Dental Advisory Given, Poor Dentition and Partial Upper,    Pulmonary shortness of breath and with exertion, COPDCurrent Smoker (2ppd),  + rhonchi   + wheezing      Cardiovascular negative cardio ROS  Rhythm:Regular Rate:Normal     Neuro/Psych Seizures - (as a child, not medicated since age 74),  Anxiety    GI/Hepatic Neg liver ROS, GERD-  Controlled,(+)     substance abuse  alcohol use,   Endo/Other  negative endocrine ROS  Renal/GU negative Renal ROS     Musculoskeletal   Abdominal Normal abdominal exam  (+)   Peds  Hematology negative hematology ROS (+)   Anesthesia Other Findings   Reproductive/Obstetrics                          Anesthesia Physical Anesthesia Plan  ASA: II  Anesthesia Plan: General   Post-op Pain Management:    Induction: Intravenous  Airway Management Planned: LMA  Additional Equipment:   Intra-op Plan:   Post-operative Plan: Extubation in OR  Informed Consent: I have reviewed the patients History and Physical, chart, labs and discussed the procedure including the risks, benefits and alternatives for the proposed anesthesia with the patient or authorized representative who has indicated his/her understanding and acceptance.   Dental advisory given  Plan Discussed with: CRNA  Anesthesia Plan Comments:         Anesthesia Quick Evaluation

## 2012-11-21 NOTE — Interval H&P Note (Signed)
History and Physical Interval Note:  11/21/2012 3:44 PM  [name] [name]  has presented today for surgery, with the diagnosis of RIGHT KNEE PATELLA TENDON RUPTURE   The various methods of treatment have been discussed with the patient and family. After consideration of risks, benefits and other options for treatment, the patient has consented to  Procedure(s): RIGHT PATELLA TENDON REPAIR (Right) as a surgical intervention .  The patient's history has been reviewed, patient examined, no change in status, stable for surgery.  I have reviewed the patient's chart and labs.  Questions were answered to the patient's satisfaction.     [name]

## 2012-11-21 NOTE — Anesthesia Postprocedure Evaluation (Signed)
  Anesthesia Post-op Note  Patient: [name] [name]  Procedure(s) Performed: Procedure(s): RIGHT PATELLA TENDON REPAIR (Right)  Patient Location: PACU  Anesthesia Type:General  Level of Consciousness: awake and alert   Airway and Oxygen Therapy: Patient Spontanous Breathing  Post-op Pain: moderate  Post-op Assessment: Post-op Vital signs reviewed  Post-op Vital Signs: stable  Complications: No apparent anesthesia complications

## 2012-11-21 NOTE — Transfer of Care (Signed)
Immediate Anesthesia Transfer of Care Note  Patient: [name] [name]  Procedure(s) Performed: Procedure(s): RIGHT PATELLA TENDON REPAIR (Right)  Patient Location: PACU  Anesthesia Type:General  Level of Consciousness: awake, alert  and oriented  Airway & Oxygen Therapy: Patient Spontanous Breathing and Patient connected to face mask oxygen  Post-op Assessment: Report given to PACU RN and Post -op Vital signs reviewed and stable  Post vital signs: Reviewed and stable  Complications: No apparent anesthesia complications

## 2012-11-21 NOTE — Brief Op Note (Signed)
11/21/2012  5:42 PM  PATIENT:  [name] [name]  52 y.o. male  PRE-OPERATIVE DIAGNOSIS:  RIGHT KNEE PATELLA TENDON RUPTURE   POST-OPERATIVE DIAGNOSIS:  right knee patella tendon rupture  PROCEDURE:  Procedure(s): RIGHT PATELLA TENDON REPAIR (Right)  SURGEON:  Surgeon(s) and Role:    * [name], MD - Primary  PHYSICIAN ASSISTANT: Lanney Gins, PA-C   ANESTHESIA:   general  EBL:  Total I/O In: 1000 [I.V.:1000] Out: 475 [Blood:475]  BLOOD ADMINISTERED:none  DRAINS: (1 medium) Hemovact drain(s) in the right knee with  Suction Open   LOCAL MEDICATIONS USED:  MARCAINE     SPECIMEN:  No Specimen  DISPOSITION OF SPECIMEN:  N/A  COUNTS:  YES  TOURNIQUET:  46 minutes at  DICTATION: .Other Dictation: Dictation Number [id number]  PLAN OF CARE: Admit to inpatient   PATIENT DISPOSITION:  PACU - hemodynamically stable.   Delay start of Pharmacological VTE agent (>24hrs) due to surgical blood loss or risk of bleeding: yes

## 2012-11-22 ENCOUNTER — Encounter (HOSPITAL_COMMUNITY): Payer: Self-pay | Admitting: Orthopedic Surgery

## 2012-11-22 LAB — BASIC METABOLIC PANEL WITH GFR
BUN: 9 mg/dL (ref 6–23)
CO2: 26 meq/L (ref 19–32)
Calcium: 8.9 mg/dL (ref 8.4–10.5)
Chloride: 99 meq/L (ref 96–112)
Creatinine, Ser: 0.75 mg/dL (ref 0.50–1.35)
GFR calc Af Amer: >90 mL/min
GFR calc non Af Amer: >90 mL/min
Glucose, Bld: 143 mg/dL — ABNORMAL HIGH (ref 70–99)
Potassium: 4.5 meq/L (ref 3.5–5.1)
Sodium: 135 meq/L (ref 135–145)

## 2012-11-22 LAB — CBC
HCT: 40.2 % (ref 39.0–52.0)
Hemoglobin: 12.9 g/dL — ABNORMAL LOW (ref 13.0–17.0)
MCH: 29.5 pg (ref 26.0–34.0)
MCHC: 32.1 g/dL (ref 30.0–36.0)
MCV: 91.8 fL (ref 78.0–100.0)
RDW: 14.4 % (ref 11.5–15.5)

## 2012-11-22 MED ORDER — TIZANIDINE HCL 4 MG PO CAPS: 4.0000 mg | ORAL_CAPSULE | Freq: Three times a day (TID) | ORAL | Status: DC

## 2012-11-22 MED ORDER — DSS 100 MG PO CAPS: 100.0000 mg | ORAL_CAPSULE | Freq: Two times a day (BID) | ORAL | Status: DC

## 2012-11-22 MED ORDER — FERROUS SULFATE 325 (65 FE) MG PO TABS: 325.0000 mg | ORAL_TABLET | Freq: Three times a day (TID) | ORAL | Status: DC

## 2012-11-22 MED ORDER — ASPIRIN 325 MG PO TBEC: 325.0000 mg | DELAYED_RELEASE_TABLET | Freq: Two times a day (BID) | ORAL | Status: DC

## 2012-11-22 MED ORDER — POLYETHYLENE GLYCOL 3350 17 G PO PACK: 17.0000 g | PACK | Freq: Two times a day (BID) | ORAL | Status: DC

## 2012-11-22 MED ORDER — PNEUMOCOCCAL VAC POLYVALENT 25 MCG/0.5ML IJ INJ
0.5000 mL | INJECTION | INTRAMUSCULAR | Status: DC
  Filled 2012-11-22: qty 0.5

## 2012-11-22 MED ORDER — PNEUMOCOCCAL VAC POLYVALENT 25 MCG/0.5ML IJ INJ
0.5000 mL | INJECTION | Freq: Once | INTRAMUSCULAR | Status: AC
  Administered 2012-11-22: 0.5 mL via INTRAMUSCULAR
  Filled 2012-11-22: qty 0.5

## 2012-11-22 MED ORDER — OXYCODONE HCL 5 MG PO TABS: 5.0000 mg | ORAL_TABLET | ORAL | Status: DC | PRN

## 2012-11-22 NOTE — Care Management Note (Signed)
    Page 1 of 1   11/22/2012     4:38:48 PM   CARE MANAGEMENT NOTE 11/22/2012  Patient:  [name] [name], [name] [name]   Account Number:  [id number]  Date Initiated:  11/22/2012  Documentation initiated by:  Colleen Can  Subjective/Objective Assessment:   dx rt patella tendon rupture; patella tendon repair     Action/Plan:   HOme upon discharge/NO [facility] RECOMMENDATIONS   Anticipated DC Date:  11/22/2012   Anticipated DC Plan:  HOME W HOME HEALTH SERVICES      DC Planning Services  CM consult      Choice offered to / List presented to:             Status of service:  Completed, signed off Medicare Important Message given?   (If response is "NO", the following Medicare IM given date fields will be blank) Date Medicare IM given:   Date Additional Medicare IM given:    Discharge Disposition:  HOME/SELF CARE  Per UR Regulation:  Reviewed for med. necessity/level of care/duration of stay  If discussed at Long Length of Stay Meetings, dates discussed:    Comments:

## 2012-11-22 NOTE — Evaluation (Signed)
Physical Therapy Evaluation Patient Details Name: [name] [name] MRN: [id number] DOB: August 14, 1960 Today's Date: 11/22/2012 Time: 7829-5621 PT Time Calculation (min): 19 min  PT Assessment / Plan / Recommendation Clinical Impression  Pt s/p R patellar tendon repair presents with decreased R LE strength and knee flex restrictions as well as post op pain limiting functional mobility.    PT Assessment  Patient needs continued PT services    Follow Up Recommendations  No PT follow up    Does the patient have the potential to tolerate intense rehabilitation      Barriers to Discharge None      Equipment Recommendations  None recommended by PT    Recommendations for Other Services     Frequency 7X/week    Precautions / Restrictions Precautions Precautions: Fall Precaution Comments: No knee flex on R - pt aware Required Braces or Orthoses: Knee Immobilizer - Right Knee Immobilizer - Right: On at all times Restrictions Weight Bearing Restrictions: No Other Position/Activity Restrictions: WBAT   Pertinent Vitals/Pain 7/10; premed, cold packs provided, muscle relaxer requested      Mobility  Bed Mobility Bed Mobility: Supine to Sit Supine to Sit: 5: Supervision Details for Bed Mobility Assistance: pt self assisting R LE with UEs Transfers Transfers: Sit to Stand;Stand to Sit Sit to Stand: 5: Supervision Stand to Sit: 5: Supervision Details for Transfer Assistance: min cues for use of UEs and for LE management Ambulation/Gait Ambulation/Gait Assistance: 4: Min guard;5: Supervision Ambulation Distance (Feet): 200 Feet Assistive device: Rolling walker Ambulation/Gait Assistance Details: cues for posture and position from RW Gait Pattern: Step-to pattern;Step-through pattern    Exercises     PT Diagnosis: Difficulty walking  PT Problem List: Decreased strength;Decreased range of motion;Decreased activity tolerance;Decreased mobility;Decreased knowledge of use of  DME;Pain;Decreased knowledge of precautions;Decreased safety awareness PT Treatment Interventions: DME instruction;Gait training;Stair training;Functional mobility training;Therapeutic activities;Therapeutic exercise;Patient/family education   PT Goals Acute Rehab PT Goals PT Goal Formulation: With patient Time For Goal Achievement: 11/26/12 Potential to Achieve Goals: Good Pt will go Supine/Side to Sit: with modified independence PT Goal: Supine/Side to Sit - Progress: Goal set today Pt will go Sit to Supine/Side: with modified independence PT Goal: Sit to Supine/Side - Progress: Goal set today Pt will go Sit to Stand: with modified independence PT Goal: Sit to Stand - Progress: Goal set today Pt will go Stand to Sit: with modified independence PT Goal: Stand to Sit - Progress: Goal set today Pt will Ambulate: >150 feet;with modified independence;with rolling walker PT Goal: Ambulate - Progress: Goal set today  Visit Information  Last PT Received On: 11/22/12 Assistance Needed: +1    Subjective Data  Subjective: I just couldn,t lift my foot and my knee was giving out under me Patient Stated Goal: Walk   Prior Functioning  Home Living Lives With: Significant other;Family;Other (Comment) Available Help at Discharge: Family;Friend(s) Type of Home: House Home Access: Ramped entrance Home Layout: One level Home Adaptive Equipment: Walker - rolling Additional Comments: Lives with sister, niece, and part time gf Prior Function Level of Independence: Independent with assistive device(s);Independent Communication Communication: No difficulties    Cognition  Cognition Arousal/Alertness: Awake/alert Behavior During Therapy: WFL for tasks assessed/performed Overall Cognitive Status: Within Functional Limits for tasks assessed    Extremity/Trunk Assessment Right Upper Extremity Assessment RUE ROM/Strength/Tone: [facility] for tasks assessed Left Upper Extremity Assessment LUE  ROM/Strength/Tone: WFL for tasks assessed Right Lower Extremity Assessment RLE ROM/Strength/Tone: Deficits RLE ROM/Strength/Tone Deficits: KI at all times, no  knee flex allowed Left Lower Extremity Assessment LLE ROM/Strength/Tone: [facility] for tasks assessed Trunk Assessment Trunk Assessment: Normal   Balance    End of Session PT - End of Session Equipment Utilized During Treatment: Gait belt;Right knee immobilizer Activity Tolerance: Patient tolerated treatment well Patient left: in chair;with call bell/phone within reach Nurse Communication: Mobility status;Other (comment) (pt requests muscle relaxor)  GP     Zacharia Sowles 11/22/2012, 12:09 PM

## 2012-11-22 NOTE — Progress Notes (Signed)
Received orders for rw and commode.  Will deliver to room prior to d/c.

## 2012-11-22 NOTE — Progress Notes (Signed)
   Subjective: 1 Day Post-Op Procedure(s) (LRB): RIGHT PATELLA TENDON REPAIR (Right)   Patient reports pain as mild, pain well controlled. No events throughout the night. States that he is ready to head home.  Objective:   VITALS:   Filed Vitals:   11/22/12 1025  BP: 125/76  Pulse: 70  Temp: 97.6 F (36.4 C)  Resp: 16    Neurovascular intact Dorsiflexion/Plantar flexion intact Incision: dressing C/D/I No cellulitis present Compartment soft  LABS  Recent Labs  11/21/12 1128 11/22/12 0438  HGB 15.5 12.9*  HCT 45.5 40.2  WBC 3.7* 8.0  PLT 105* 117*     Recent Labs  11/21/12 1128 11/22/12 0438  NA 139 135  K 4.4 4.5  BUN 10 9  CREATININE 0.92 0.75  GLUCOSE 102* 143*     Assessment/Plan: 1 Day Post-Op Procedure(s) (LRB): RIGHT PATELLA TENDON REPAIR (Right) HV drain d/c'ed Foley cath d/c'ed Dressing changed to 4x4 guaze and tape, wound looks good. STRESSED the importance to not bend the knee at all Advance diet Up with therapy D/C IV fluids Discharge home with home health Follow up in 2 weeks at [facility]. Follow up with OLIN,Dyana Magner D in 2 weeks.  Contact information:  [facility] [address] [place] [address] [phone]           [name]. Haya Hemler   PAC  11/22/2012, 2:00 PM

## 2012-11-22 NOTE — Progress Notes (Addendum)
Discharged from floor ambulatory, meeting friend at door. No changes in assessment. [name] [name], [employer]

## 2012-11-22 NOTE — Evaluation (Signed)
Occupational Therapy Evaluation and Discharge Summary Patient Details Name: [name] [name] MRN: [id number] DOB: 1961-06-16 Today's Date: 11/22/2012 Time: 7829-5621 OT Time Calculation (min): 18 min  OT Assessment / Plan / Recommendation Clinical Impression  Pt is a 52 yo male admitted for patellar tendorn repair who is overall S with all adls and mobilty. No acute OT needs at this time.    OT Assessment  Patient does not need any further OT services    Follow Up Recommendations  No OT follow up    Barriers to Discharge      Equipment Recommendations  3 in 1 bedside comode    Recommendations for Other Services    Frequency       Precautions / Restrictions Precautions Precautions: Fall Precaution Comments: No knee flex on R - pt aware Required Braces or Orthoses: Knee Immobilizer - Right Knee Immobilizer - Right: On at all times Restrictions Weight Bearing Restrictions: No Other Position/Activity Restrictions: WBAT   Pertinent Vitals/Pain Pt with 4/10 pain in L knee.    ADL  Eating/Feeding: Performed;Independent Where Assessed - Eating/Feeding: Chair Grooming: Performed;Supervision/safety Where Assessed - Grooming: Supported standing Upper Body Bathing: Performed;Set up Where Assessed - Upper Body Bathing: Unsupported sitting Lower Body Bathing: Performed;Supervision/safety Where Assessed - Lower Body Bathing: Unsupported sit to stand Upper Body Dressing: Performed;Set up Where Assessed - Upper Body Dressing: Unsupported sitting Lower Body Dressing: Performed;Supervision/safety Where Assessed - Lower Body Dressing: Unsupported sit to stand Toilet Transfer: Performed;Supervision/safety Toilet Transfer Method: Other (comment) (ambulated to br.) Toilet Transfer Equipment: Raised toilet seat with arms (or 3-in-1 over toilet) Toileting - Clothing Manipulation and Hygiene: Performed;Supervision/safety Where Assessed - Glass blower/designer Manipulation and Hygiene:  Standing Equipment Used: Rolling walker;Knee Immobilizer Transfers/Ambulation Related to ADLs: Pt walked in hallway and in room with S.  Pt is mildly impulsive at times wanting to carry the wallker instead of use it. ADL Comments: Pt overall S with all adls and will have S at home. Pt moves quickly with all mobilty. Encouraged to slow down a bit to maintain safety.    OT Diagnosis:    OT Problem List:   OT Treatment Interventions:     OT Goals    Visit Information  Last OT Received On: 11/22/12 Assistance Needed: +1    Subjective Data  Subjective: "I am ready to go home." Patient Stated Goal: to get this knee working again.   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 Dominant Hand: Right         Vision/Perception Vision - History Baseline Vision: No visual deficits Patient Visual Report: No change from baseline Vision - Assessment Vision Assessment: Vision not tested   [employer] Arousal/Alertness: Awake/alert Behavior During Therapy: WFL for tasks assessed/performed Overall Cognitive Status: Within Functional Limits for tasks assessed    Extremity/Trunk Assessment Right Upper Extremity Assessment RUE ROM/Strength/Tone: WFL for tasks assessed RUE Sensation: WFL - Light Touch RUE Coordination: WFL - gross/fine motor Left Upper Extremity Assessment LUE  ROM/Strength/Tone: WFL for tasks assessed LUE Sensation: WFL - Light Touch LUE Coordination:  WFL - gross/fine motor Right Lower Extremity Assessment RLE ROM/Strength/Tone: Deficits RLE ROM/Strength/Tone Deficits: KI at all times, no knee flex allowed Left Lower Extremity Assessment LLE ROM/Strength/Tone: [facility] for tasks assessed Trunk Assessment Trunk Assessment: Normal     Mobility Bed Mobility Bed Mobility: Supine to Sit Supine to Sit: 5: Supervision Details for Bed Mobility Assistance: pt self assisting R LE with UEs Transfers Transfers: Sit to Stand;Stand to Sit Sit to Stand: 5: Supervision Stand to Sit: 5: Supervision Details for Transfer Assistance: Cues for safety.     Exercise     Balance     End of Session OT - End of Session Equipment Utilized During Treatment: Right knee immobilizer Activity Tolerance: Patient tolerated treatment well Patient left: in chair;with call bell/phone within reach Nurse Communication: Mobility status  GO     Hope Budds 11/22/2012, 12:48 PM [phone]

## 2012-11-22 NOTE — Op Note (Signed)
NAME:  [name] [name], [name] [name]           ACCOUNT NO.:  [id number]  MEDICAL RECORD NO.:  [id number]  LOCATION:  [facility]                         FACILITY:  [facility]  PHYSICIAN:  Madlyn Frankel. Charlann Boxer, M.D.  DATE OF BIRTH:  Jan 14, 1961  DATE OF PROCEDURE:  11/21/2012 DATE OF DISCHARGE:                              OPERATIVE REPORT   PREOPERATIVE DIAGNOSES: 1. Ruptured right patellar tendon. 2. History of right patellar fracture with retained hardware.  POSTOPERATIVE DIAGNOSES: 1. Ruptured right patellar tendon. 2. History of right patellar fracture with retained hardware.  PROCEDURE: 1. Removal of deep implant including two 0.62 K-wires and a 16-gauge     wire from a cerclage of a patella fracture. 2. Open patella tendon repair utilizing two #2 FiberWire sutures, as     well as repairing the retinacular layer with #1 Vicryl.  SURGEON:  Madlyn Frankel. Charlann Boxer, MD  ASSISTANT:  Lanney Gins, PA-C.  Note that Mr. [name] [name] was present for the entirety of case from preoperative positioning of the patient, perioperative management, operative extremity, general facilitation of case, and primary wound closure.  ANESTHESIA:  General.  SPECIMENS:  None.  COMPLICATIONS:  None.  BLOOD LOSS:  About 450 mL.  The patient was noted to have significant amount of bleeding from the intra-articular aspect of knee.  FINDINGS:  The patient was noted to have what appeared to be more of a chronic type injury to his patellar tendon.  The tissue was very stiff and rigid and difficult to manage.  Mobility was challenged as it will be noted in the body of the paragraph.  TOURNIQUET TIME:  45 minutes at 250 mmHg.  INDICATIONS FOR PROCEDURE:  Mr. [name] [name] is a 52 year old male, who has chronic alcohol problems as well as history of a right patellar fracture about 3-4 months ago.  He had been seen intermittently in recovery in the office for followup evaluation.  He most recently presented to the office with inability to  perform straight leg raise with obvious palpable defect and pain and swelling.  At this point, he was set up for surgical repair of his patellar tendon.  Risks and benefits were discussed.  Based on radiographic findings, risks of recurrent injury, complications of postoperative stiffness, inability to function normally all reviewed.  Consent was obtained for above.  PROCEDURE IN DETAIL:  The patient was brought to operative theater. Once adequate anesthesia, preoperative antibiotics, Ancef administered, he was positioned supine with the right thigh tourniquet placed.  The right lower extremity was then prepped and draped in sterile fashion.  A time-out was performed identifying the patient, planned procedure, and extremity.  Leg was exsanguinated, tourniquet elevated to 250 mmHg. Midline incision was made and soft tissue planes created.  Obvious disruption of the patellar tendon was identified.  Chronicity was evident based on the appearance of the blood inside the knee and it appeared to be aged hematoma as well as the end of the distal patella as well as the patellar tendon end.  My first attention was removal of the deep implants.  I created a retinacular layer over the patella dissecting down to the patella identifying the 16-gauge wire.  I then cut this in 2 separate places and  removed the wire without difficulty.  At this point, following further exposure of the patella, I was able to remove the two 0.62 K-wires from the patella. There was no evidence of any mobility or evidence of nonunion at this point.  At the fracture site, the fracture was readily healed.  At this point, I decided that in order to mobilize this very stiff and rigid patella that I needed to make some vertical incisions on the medial and lateral aspect of the patellar tendon.  The patient noted to have retinacular tear and injury medially and laterally.  I then removed intra-articular scar in the knee to  help with further mobilization of the patellar tendon.  The end of the patellar tendon was very unhealthy in appearance.  However, once this was done and boundaries of the patellar tendon identified, I used two #2 FiberWire sutures that were passed in a Krackow pattern through the medial and lateral aspect of the patella. The remaining 4 suture ends were proximally in the patella.  I then drilled 3 drill holes in the patella and using the suture passer passed the suture ends through the tendon.  We then used a bone tenaculum and reapproximated the patella to the tendon with me applying direct proximal pressure on the tendon and Lanney Gins holding inferior based pressure on the patella.  I was able to reapproximate the medial sutures and lateral sutures and then the 2 sutures together in the central aspect.  At this point, there was adequate apposition of the patellar tendon to the patella.  Please note that I debrided the end of the patella to stimulate bone bleeding to stimulate healing of this tendinous tissue.  At this point, I went ahead and repaired the proximal quadriceps tendon where it had split and made this retinacular tissue for the dissection of the patella using #1 Vicryl.  I used #1 Vicryl and also reapproximated the medial and lateral retinacular tissues as well as the medial and lateral parapatellar incisions.  Based on the patient's soft tissues and scarring, this was not a watertight seal.  I did let the tourniquet down after 45 minutes and there was significant amount of bleeding intra-articularly, however, I did not take this down.  Instead I placed a medium Hemovac drain deep.  I then reapproximated the skin edges using 2-0 Vicryl and staples on the skin.  Suction was placed to drain.  There was noted to be a slight skin tear on the lateral aspect of his knee that I reapproximated using a 2-0 nylon.  At this point, the knee was cleaned, dried, and dressed  sterilely using Xeroform and a bulky sterile wrap.  He was brought to the recovery room in stable condition with the knee immobilizer in place.  Postoperatively he can be weightbearing as tolerated.  He will need to remain in knee immobilizer at least for 4-5 weeks to try to get some tendon healing before we start to move him.  This was significant compromise as Mr. 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  Obviously given the compliance with his patellar fracture, I worry about the overall success of this.  I will stress with him the importance no matter what.  Hopefully we can maintain some healing and normalcy of his state of mind with alcohol consumption to prevent any complications.     Madlyn Frankel Charlann Boxer, M.D.  MDO/MEDQ  D:  11/21/2012  T:  11/22/2012  Job:  [id number]
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
## 2013-01-11 ENCOUNTER — Encounter (HOSPITAL_COMMUNITY): Payer: Self-pay | Admitting: *Deleted

## 2013-01-11 ENCOUNTER — Encounter (HOSPITAL_COMMUNITY): Payer: Self-pay | Admitting: Pharmacy Technician

## 2013-01-11 NOTE — Pre-Procedure Instructions (Signed)
Need orders put in EPIC please as patient to be a Same Day ( no PST appt.) ! Thank you!
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
## 2013-01-15 ENCOUNTER — Encounter (HOSPITAL_COMMUNITY): Payer: Self-pay

## 2013-01-15 ENCOUNTER — Encounter (HOSPITAL_COMMUNITY)
Admission: RE | Disposition: A | Discharge status: Home Health | Payer: Self-pay | Source: Ambulatory Visit | Attending: Orthopedic Surgery

## 2013-01-15 ENCOUNTER — Encounter (HOSPITAL_COMMUNITY): Payer: Self-pay | Admitting: Anesthesiology

## 2013-01-15 ENCOUNTER — Inpatient Hospital Stay (HOSPITAL_COMMUNITY)
Admission: RE | Admit: 2013-01-15 | Discharge: 2013-01-17 | DRG: 502 | Disposition: A | Discharge status: Home Health | Payer: MEDICAID | Source: Ambulatory Visit | Attending: Orthopedic Surgery | Admitting: Orthopedic Surgery

## 2013-01-15 ENCOUNTER — Inpatient Hospital Stay (HOSPITAL_COMMUNITY): Payer: Self-pay | Admitting: Anesthesiology

## 2013-01-15 DIAGNOSIS — Z79899 Other long term (current) drug therapy: Secondary | ICD-10-CM

## 2013-01-15 DIAGNOSIS — M66269 Spontaneous rupture of extensor tendons, unspecified lower leg: Principal | ICD-10-CM | POA: Diagnosis present

## 2013-01-15 DIAGNOSIS — S86819A Strain of other muscle(s) and tendon(s) at lower leg level, unspecified leg, initial encounter: Secondary | ICD-10-CM

## 2013-01-15 DIAGNOSIS — F17200 Nicotine dependence, unspecified, uncomplicated: Secondary | ICD-10-CM | POA: Diagnosis present

## 2013-01-15 DIAGNOSIS — S86811D Strain of other muscle(s) and tendon(s) at lower leg level, right leg, subsequent encounter: Secondary | ICD-10-CM

## 2013-01-15 DIAGNOSIS — Z7982 Long term (current) use of aspirin: Secondary | ICD-10-CM

## 2013-01-15 DIAGNOSIS — M25569 Pain in unspecified knee: Secondary | ICD-10-CM | POA: Diagnosis present

## 2013-01-15 HISTORY — DX: Anemia, unspecified: D64.9

## 2013-01-15 HISTORY — DX: Unspecified convulsions: R56.9

## 2013-01-15 HISTORY — DX: Unspecified osteoarthritis, unspecified site: M19.90

## 2013-01-15 HISTORY — PX: PATELLAR TENDON REPAIR: SHX737

## 2013-01-15 LAB — PROTIME-INR: Prothrombin Time: 12.5 seconds (ref 11.6–15.2)

## 2013-01-15 LAB — SURGICAL PCR SCREEN: MRSA, PCR: NEGATIVE

## 2013-01-15 LAB — URINALYSIS, ROUTINE W REFLEX MICROSCOPIC
Bilirubin Urine: NEGATIVE
Glucose, UA: NEGATIVE mg/dL
Ketones, ur: NEGATIVE mg/dL
Leukocytes, UA: NEGATIVE
Protein, ur: NEGATIVE mg/dL

## 2013-01-15 LAB — APTT: aPTT: 25 seconds (ref 24–37)

## 2013-01-15 LAB — BASIC METABOLIC PANEL
Chloride: 103 mEq/L (ref 96–112)
GFR calc Af Amer: >90 mL/min (ref >90)
GFR calc non Af Amer: >90 mL/min (ref >90)
Potassium: 4 mEq/L (ref 3.5–5.1)

## 2013-01-15 LAB — CBC
Platelets: 133 10*3/uL — ABNORMAL LOW (ref 150–400)
RDW: 14.7 % (ref 11.5–15.5)
WBC: 5.5 10*3/uL (ref 4.0–10.5)

## 2013-01-15 SURGERY — REPAIR, TENDON, PATELLAR
Anesthesia: General | Site: Knee | Laterality: Right | Wound class: Clean

## 2013-01-15 MED ORDER — MIDAZOLAM HCL 5 MG/5ML IJ SOLN
INTRAMUSCULAR | Status: DC | PRN
  Administered 2013-01-15: 2 mg via INTRAVENOUS

## 2013-01-15 MED ORDER — FLEET ENEMA 7-19 GM/118ML RE ENEM: 1.0000 | ENEMA | Freq: Once | RECTAL | Status: AC | PRN

## 2013-01-15 MED ORDER — HYDROMORPHONE HCL PF 1 MG/ML IJ SOLN
0.2500 mg | INTRAMUSCULAR | Status: DC | PRN
  Administered 2013-01-15 (×2): 0.5 mg via INTRAVENOUS

## 2013-01-15 MED ORDER — SODIUM CHLORIDE 0.9 % IV SOLN
INTRAVENOUS | Status: DC
  Administered 2013-01-15 – 2013-01-16 (×2): via INTRAVENOUS
  Filled 2013-01-15 (×10): qty 1000

## 2013-01-15 MED ORDER — DEXAMETHASONE SODIUM PHOSPHATE 10 MG/ML IJ SOLN
10.0000 mg | Freq: Once | INTRAMUSCULAR | Status: AC
  Administered 2013-01-16: 10 mg via INTRAVENOUS
  Filled 2013-01-15: qty 1

## 2013-01-15 MED ORDER — MUPIROCIN 2 % EX OINT
TOPICAL_OINTMENT | Freq: Once | CUTANEOUS | Status: DC
  Filled 2013-01-15: qty 22

## 2013-01-15 MED ORDER — FENTANYL CITRATE 0.05 MG/ML IJ SOLN
INTRAMUSCULAR | Status: DC | PRN
  Administered 2013-01-15: 100 ug via INTRAVENOUS
  Administered 2013-01-15 (×3): 50 ug via INTRAVENOUS

## 2013-01-15 MED ORDER — METHOCARBAMOL 500 MG PO TABS
500.0000 mg | ORAL_TABLET | Freq: Four times a day (QID) | ORAL | Status: DC | PRN
  Administered 2013-01-16 – 2013-01-17 (×4): 500 mg via ORAL
  Filled 2013-01-15 (×4): qty 1

## 2013-01-15 MED ORDER — PROPOFOL 10 MG/ML IV BOLUS
INTRAVENOUS | Status: DC | PRN
  Administered 2013-01-15: 200 mg via INTRAVENOUS

## 2013-01-15 MED ORDER — DEXTROSE 5 % IV SOLN
500.0000 mg | Freq: Four times a day (QID) | INTRAVENOUS | Status: DC | PRN
  Administered 2013-01-15 (×2): 500 mg via INTRAVENOUS
  Filled 2013-01-15 (×2): qty 5

## 2013-01-15 MED ORDER — PHENOL 1.4 % MT LIQD: 1.0000 | OROMUCOSAL | Status: DC | PRN

## 2013-01-15 MED ORDER — PROMETHAZINE HCL 25 MG/ML IJ SOLN: 6.2500 mg | INTRAMUSCULAR | Status: DC | PRN

## 2013-01-15 MED ORDER — POLYETHYLENE GLYCOL 3350 17 G PO PACK
17.0000 g | PACK | Freq: Two times a day (BID) | ORAL | Status: DC
  Administered 2013-01-16 (×2): 17 g via ORAL

## 2013-01-15 MED ORDER — DIPHENHYDRAMINE HCL 25 MG PO CAPS: 25.0000 mg | ORAL_CAPSULE | Freq: Four times a day (QID) | ORAL | Status: DC | PRN

## 2013-01-15 MED ORDER — ONDANSETRON HCL 4 MG/2ML IJ SOLN: 4.0000 mg | Freq: Four times a day (QID) | INTRAMUSCULAR | Status: DC | PRN

## 2013-01-15 MED ORDER — ALUM & MAG HYDROXIDE-SIMETH 200-200-20 MG/5ML PO SUSP
30.0000 mL | ORAL | Status: DC | PRN
  Administered 2013-01-16 (×2): 30 mL via ORAL
  Filled 2013-01-15 (×2): qty 30

## 2013-01-15 MED ORDER — ZOLPIDEM TARTRATE 5 MG PO TABS
5.0000 mg | ORAL_TABLET | Freq: Every evening | ORAL | Status: DC | PRN
  Administered 2013-01-16 – 2013-01-17 (×2): 5 mg via ORAL
  Filled 2013-01-15 (×2): qty 1

## 2013-01-15 MED ORDER — ROPIVACAINE HCL 5 MG/ML IJ SOLN
INTRAMUSCULAR | Status: DC | PRN
  Administered 2013-01-15: 30 mL via EPIDURAL

## 2013-01-15 MED ORDER — HYDROMORPHONE HCL PF 1 MG/ML IJ SOLN
INTRAMUSCULAR | Status: DC | PRN
  Administered 2013-01-15: 1 mg via INTRAVENOUS
  Administered 2013-01-15 (×2): 0.5 mg via INTRAVENOUS

## 2013-01-15 MED ORDER — LIDOCAINE HCL (CARDIAC) 20 MG/ML IV SOLN
INTRAVENOUS | Status: DC | PRN
  Administered 2013-01-15: 100 mg via INTRAVENOUS

## 2013-01-15 MED ORDER — 0.9 % SODIUM CHLORIDE (POUR BTL) OPTIME
TOPICAL | Status: DC | PRN
  Administered 2013-01-15: 1000 mL

## 2013-01-15 MED ORDER — METOCLOPRAMIDE HCL 5 MG/ML IJ SOLN: 5.0000 mg | Freq: Three times a day (TID) | INTRAMUSCULAR | Status: DC | PRN

## 2013-01-15 MED ORDER — FERROUS SULFATE 325 (65 FE) MG PO TABS
325.0000 mg | ORAL_TABLET | Freq: Three times a day (TID) | ORAL | Status: DC
  Administered 2013-01-15 – 2013-01-17 (×5): 325 mg via ORAL
  Filled 2013-01-15 (×8): qty 1

## 2013-01-15 MED ORDER — BISACODYL 10 MG RE SUPP
10.0000 mg | Freq: Every day | RECTAL | Status: DC | PRN
  Administered 2013-01-17: 10 mg via RECTAL
  Filled 2013-01-15: qty 1

## 2013-01-15 MED ORDER — FENTANYL CITRATE 0.05 MG/ML IJ SOLN: 25.0000 ug | INTRAMUSCULAR | Status: DC | PRN

## 2013-01-15 MED ORDER — CHLORHEXIDINE GLUCONATE 4 % EX LIQD
60.0000 mL | Freq: Once | CUTANEOUS | Status: DC
  Filled 2013-01-15: qty 60

## 2013-01-15 MED ORDER — ASPIRIN EC 325 MG PO TBEC
325.0000 mg | DELAYED_RELEASE_TABLET | Freq: Two times a day (BID) | ORAL | Status: DC
  Administered 2013-01-16 – 2013-01-17 (×3): 325 mg via ORAL
  Filled 2013-01-15 (×5): qty 1

## 2013-01-15 MED ORDER — OXYCODONE HCL 5 MG PO TABS
5.0000 mg | ORAL_TABLET | ORAL | Status: DC | PRN
Start: 2013-01-15 — End: 2013-01-17
  Administered 2013-01-15 – 2013-01-16 (×6): 15 mg via ORAL
  Administered 2013-01-16: 10 mg via ORAL
  Administered 2013-01-17 (×3): 15 mg via ORAL
  Filled 2013-01-15 (×7): qty 3
  Filled 2013-01-15: qty 2
  Filled 2013-01-15 (×3): qty 3

## 2013-01-15 MED ORDER — ONDANSETRON HCL 4 MG/2ML IJ SOLN
INTRAMUSCULAR | Status: DC | PRN
  Administered 2013-01-15: 4 mg via INTRAVENOUS

## 2013-01-15 MED ORDER — DOCUSATE SODIUM 100 MG PO CAPS
100.0000 mg | ORAL_CAPSULE | Freq: Two times a day (BID) | ORAL | Status: DC
  Administered 2013-01-16 – 2013-01-17 (×3): 100 mg via ORAL

## 2013-01-15 MED ORDER — CEFAZOLIN SODIUM-DEXTROSE 2-3 GM-% IV SOLR
2.0000 g | Freq: Four times a day (QID) | INTRAVENOUS | Status: AC
  Administered 2013-01-15 – 2013-01-16 (×2): 2 g via INTRAVENOUS
  Filled 2013-01-15 (×2): qty 50

## 2013-01-15 MED ORDER — METOCLOPRAMIDE HCL 10 MG PO TABS
5.0000 mg | ORAL_TABLET | Freq: Three times a day (TID) | ORAL | Status: DC | PRN
  Administered 2013-01-16: 10 mg via ORAL
  Filled 2013-01-15: qty 1

## 2013-01-15 MED ORDER — HYDROMORPHONE HCL PF 1 MG/ML IJ SOLN
0.5000 mg | INTRAMUSCULAR | Status: DC | PRN
  Administered 2013-01-15 – 2013-01-16 (×3): 1 mg via INTRAVENOUS
  Administered 2013-01-16 (×5): 2 mg via INTRAVENOUS
  Administered 2013-01-16: 1 mg via INTRAVENOUS
  Administered 2013-01-17 (×2): 2 mg via INTRAVENOUS
  Filled 2013-01-15 (×3): qty 2
  Filled 2013-01-15 (×2): qty 1
  Filled 2013-01-15 (×2): qty 2
  Filled 2013-01-15: qty 1
  Filled 2013-01-15: qty 2
  Filled 2013-01-15: qty 1
  Filled 2013-01-15: qty 2

## 2013-01-15 MED ORDER — ONDANSETRON HCL 4 MG PO TABS: 4.0000 mg | ORAL_TABLET | Freq: Four times a day (QID) | ORAL | Status: DC | PRN

## 2013-01-15 MED ORDER — LACTATED RINGERS IV SOLN
INTRAVENOUS | Status: DC
Start: 2013-01-15 — End: 2013-01-15
  Administered 2013-01-15: 1000 mL via INTRAVENOUS
  Administered 2013-01-15: 12:00:00 via INTRAVENOUS

## 2013-01-15 MED ORDER — CELECOXIB 200 MG PO CAPS
200.0000 mg | ORAL_CAPSULE | Freq: Two times a day (BID) | ORAL | Status: DC
  Administered 2013-01-15 – 2013-01-17 (×4): 200 mg via ORAL
  Filled 2013-01-15 (×5): qty 1

## 2013-01-15 MED ORDER — MENTHOL 3 MG MT LOZG: 1.0000 | LOZENGE | OROMUCOSAL | Status: DC | PRN

## 2013-01-15 MED ORDER — CEFAZOLIN SODIUM-DEXTROSE 2-3 GM-% IV SOLR
2.0000 g | INTRAVENOUS | Status: AC
  Administered 2013-01-15: 2 g via INTRAVENOUS

## 2013-01-15 SURGICAL SUPPLY — 44 items
ADH SKN CLS APL DERMABOND .7 (GAUZE/BANDAGES/DRESSINGS)
BAG SPEC THK2 15X12 ZIP CLS (MISCELLANEOUS)
BAG ZIPLOCK 12X15 (MISCELLANEOUS) ×1 IMPLANT
BANDAGE ELASTIC 6 VELCRO ST LF (GAUZE/BANDAGES/DRESSINGS) ×2 IMPLANT
BANDAGE ESMARK 6X9 LF (GAUZE/BANDAGES/DRESSINGS) ×1 IMPLANT
BIT DRILL 2.4X128 (BIT) ×2 IMPLANT
BNDG CMPR 9X6 STRL LF SNTH (GAUZE/BANDAGES/DRESSINGS) ×1
BNDG ESMARK 6X9 LF (GAUZE/BANDAGES/DRESSINGS) ×2
CLOTH BEACON ORANGE TIMEOUT ST (SAFETY) ×2 IMPLANT
CUFF TOURN SGL QUICK 34 (TOURNIQUET CUFF) ×2
CUFF TRNQT CYL 34X4X40X1 (TOURNIQUET CUFF) ×1 IMPLANT
DERMABOND ADVANCED (GAUZE/BANDAGES/DRESSINGS)
DERMABOND ADVANCED .7 DNX12 (GAUZE/BANDAGES/DRESSINGS) IMPLANT
DRSG AQUACEL AG ADV 3.5X10 (GAUZE/BANDAGES/DRESSINGS) ×1 IMPLANT
DURAPREP 26ML APPLICATOR (WOUND CARE) ×2 IMPLANT
ELECT REM PT RETURN 9FT ADLT (ELECTROSURGICAL) ×2
ELECTRODE REM PT RTRN 9FT ADLT (ELECTROSURGICAL) ×1 IMPLANT
FACESHIELD LNG OPTICON STERILE (SAFETY) ×4 IMPLANT
GLOVE BIOGEL PI IND STRL 7.5 (GLOVE) ×1 IMPLANT
GLOVE BIOGEL PI IND STRL 8 (GLOVE) ×1 IMPLANT
GLOVE BIOGEL PI INDICATOR 7.5 (GLOVE) ×1
GLOVE BIOGEL PI INDICATOR 8 (GLOVE) ×1
GLOVE ECLIPSE 8.0 STRL XLNG CF (GLOVE) ×2 IMPLANT
GLOVE ORTHO TXT STRL SZ7.5 (GLOVE) ×4 IMPLANT
GOWN PREVENTION PLUS XXLARGE (GOWN DISPOSABLE) ×3 IMPLANT
GOWN STRL NON-REIN LRG LVL3 (GOWN DISPOSABLE) ×2 IMPLANT
IMMOBILIZER KNEE 20 (SOFTGOODS) ×2
IMMOBILIZER KNEE 20 THIGH 36 (SOFTGOODS) IMPLANT
MANIFOLD NEPTUNE II (INSTRUMENTS) ×2 IMPLANT
NS IRRIG 1000ML POUR BTL (IV SOLUTION) ×2 IMPLANT
PACK TOTAL JOINT (CUSTOM PROCEDURE TRAY) ×2 IMPLANT
PASSER SUT SWANSON 36MM LOOP (INSTRUMENTS) ×2 IMPLANT
POSITIONER SURGICAL ARM (MISCELLANEOUS) ×2 IMPLANT
SUT ETHIBOND NAB CT1 #1 30IN (SUTURE) ×2 IMPLANT
SUT FIBERWIRE #2 38 T-5 BLUE (SUTURE) ×4
SUT MNCRL AB 3-0 PS2 18 (SUTURE) ×1 IMPLANT
SUT MNCRL AB 4-0 PS2 18 (SUTURE) ×1 IMPLANT
SUT VIC AB 1 CT1 36 (SUTURE) ×3 IMPLANT
SUT VIC AB 2-0 CT1 27 (SUTURE) ×4
SUT VIC AB 2-0 CT1 TAPERPNT 27 (SUTURE) ×2 IMPLANT
SUT WIRE 16GA (Orthopedic Implant) ×1 IMPLANT
SUTURE FIBERWR #2 38 T-5 BLUE (SUTURE) IMPLANT
TOWEL OR 17X26 10 PK STRL BLUE (TOWEL DISPOSABLE) ×3 IMPLANT
TOWEL OR NON WOVEN STRL DISP B (DISPOSABLE) ×2 IMPLANT

## 2013-01-15 NOTE — Anesthesia Preprocedure Evaluation (Signed)
Anesthesia Evaluation  Patient identified by MRN, date of birth, ID band Patient awake  General Assessment Comment:.  History of blood transfusion     .  Seizures         as child-outgrew them-None since age 52   .  Anemia     .  Arthritis          Reviewed: Allergy & Precautions, H&P , NPO status , Patient's Chart, lab work & pertinent test results  Airway Mallampati: II TM Distance: >3 FB Neck ROM: Full    Dental no notable dental hx.    Pulmonary neg pulmonary ROS,  breath sounds clear to auscultation  Pulmonary exam normal       Cardiovascular Exercise Tolerance: Good negative cardio ROS  Rhythm:Regular Rate:Normal     Neuro/Psych Seizures -,   Neuromuscular disease negative psych ROS   GI/Hepatic negative GI ROS, Neg liver ROS,   Endo/Other  negative endocrine ROS  Renal/GU negative Renal ROS  negative genitourinary   Musculoskeletal negative musculoskeletal ROS (+)   Abdominal   Peds negative pediatric ROS (+)  Hematology negative hematology ROS (+)   Anesthesia Other Findings   Reproductive/Obstetrics negative OB ROS                           Anesthesia Physical Anesthesia Plan  ASA: II  Anesthesia Plan: General   Post-op Pain Management:    Induction: Intravenous  Airway Management Planned:   Additional Equipment:   Intra-op Plan:   Post-operative Plan: Extubation in OR  Informed Consent: I have reviewed the patients History and Physical, chart, labs and discussed the procedure including the risks, benefits and alternatives for the proposed anesthesia with the patient or authorized representative who has indicated his/her understanding and acceptance.   Dental advisory given  Plan Discussed with: CRNA  Anesthesia Plan Comments: (Discussed risks of femoral nerve block including failure, bleeding, infection, nerve damage.  Femoral nerve block does not usually  prevent all pain. Specifically, it treats the anterior, but often not the posterior knee. Questions answered.  Patient consents to block.)        Anesthesia Quick Evaluation

## 2013-01-15 NOTE — Transfer of Care (Signed)
Immediate Anesthesia Transfer of Care Note  Patient: [name] [name]  Procedure(s) Performed: Procedure(s): OPEN REVISION PATELLA TENDON REPAIR RIGHT  (Right)  Patient Location: PACU  Anesthesia Type:General  Level of Consciousness: awake, alert  and oriented  Airway & Oxygen Therapy: Patient Spontanous Breathing and Patient connected to face mask oxygen  Post-op Assessment: Report given to PACU RN and Post -op Vital signs reviewed and stable  Post vital signs: Reviewed and stable  Complications: No apparent anesthesia complications

## 2013-01-15 NOTE — Anesthesia Postprocedure Evaluation (Signed)
  Anesthesia Post-op Note  Patient: [name] [name]  Procedure(s) Performed: Procedure(s) (LRB): OPEN REVISION PATELLA TENDON REPAIR RIGHT  (Right)  Patient Location: PACU  Anesthesia Type: GA combined with regional for post-op pain  Level of Consciousness: awake and alert   Airway and Oxygen Therapy: Patient Spontanous Breathing  Post-op Pain: mild  Post-op Assessment: Post-op Vital signs reviewed, Patient's Cardiovascular Status Stable, Respiratory Function Stable, Patent Airway and No signs of Nausea or vomiting  Last Vitals:  Filed Vitals:   01/15/13 1504  BP: 161/90  Pulse: 55  Temp: 36.4 C  Resp: 14    Post-op Vital Signs: stable   Complications: No apparent anesthesia complications

## 2013-01-15 NOTE — Interval H&P Note (Signed)
History and Physical Interval Note:  01/15/2013 8:51 AM  [name] [name]  has presented today for surgery, with the diagnosis of RIGHT PATELLA TENDON RUPTURE  The various methods of treatment have been discussed with the patient and family. After consideration of risks, benefits and other options for treatment, the patient has consented to  Procedure(s): OPEN REVISION PATELLA TENDON REPAIR RIGHT  (Right) as a surgical intervention .  The patient's history has been reviewed, patient examined, no change in status, stable for surgery.  I have reviewed the patient's chart and labs.  Questions were answered to the patient's satisfaction.     [name]

## 2013-01-15 NOTE — Anesthesia Procedure Notes (Signed)
Anesthesia Regional Block:  Femoral nerve block  Pre-Anesthetic Checklist: ,, timeout performed, Correct Patient, Correct Site, Correct Laterality, Correct Procedure, Correct Position, site marked, Risks and benefits discussed,  Surgical consent,  Pre-op evaluation,  At surgeon's request and post-op pain management  Laterality: Lower and Right  Prep: chloraprep       Needles:  Injection technique: Single-shot  Needle Type: Stimiplex          Additional Needles:  Procedures: ultrasound guided (picture in chart) and nerve stimulator Femoral nerve block  Nerve Stimulator or Paresthesia:  Response: quadriceps, 0.6 mA,   Additional Responses:   Narrative:   Performed by: Personally  Anesthesiologist: Janai Maudlin  Additional Notes: No pain on injection. No increased resistance to injection. Motor intact immediately after block.  Femoral nerve block

## 2013-01-15 NOTE — Brief Op Note (Signed)
01/15/2013  2:36 PM  PATIENT:  [name]  52 y.o. male  PRE-OPERATIVE DIAGNOSIS:  RIGHT PATELLA TENDON re-RUPTURE  POST-OPERATIVE DIAGNOSIS:  RIGHT PATELLA TENDON re-RUPTURE  PROCEDURE:  Procedure(s): OPEN REVISION PATELLA TENDON REPAIR RIGHT  (Right) with augmentation  SURGEON:  Surgeon(s) and Role:    * [name], MD - Primary  PHYSICIAN ASSISTANT: Lanney Gins, PA-C  ANESTHESIA:   regional and general  EBL:  Total I/O In: 1755 [I.V.:1700; IV Piggyback:55] Out: -   BLOOD ADMINISTERED:none  DRAINS: none   LOCAL MEDICATIONS USED:  NONE  SPECIMEN:  No Specimen  DISPOSITION OF SPECIMEN:  N/A  COUNTS:  YES  TOURNIQUET:   Total Tourniquet Time Documented: Thigh (Right) - 70 minutes Total: Thigh (Right) - 70 minutes   DICTATION: .Other Dictation: Dictation Number [id number]  PLAN OF CARE: Admit to inpatient   PATIENT DISPOSITION:  PACU - hemodynamically stable.   Delay start of Pharmacological VTE agent (>24hrs) due to surgical blood loss or risk of bleeding: no

## 2013-01-16 ENCOUNTER — Encounter (HOSPITAL_COMMUNITY): Payer: Self-pay | Admitting: Orthopedic Surgery

## 2013-01-16 LAB — CBC
HCT: 40.9 % (ref 39.0–52.0)
MCV: 91.5 fL (ref 78.0–100.0)
Platelets: 120 10*3/uL — ABNORMAL LOW (ref 150–400)
RBC: 4.47 MIL/uL (ref 4.22–5.81)
WBC: 5.7 10*3/uL (ref 4.0–10.5)

## 2013-01-16 LAB — BASIC METABOLIC PANEL
BUN: 9 mg/dL (ref 6–23)
CO2: 29 mEq/L (ref 19–32)
Chloride: 103 mEq/L (ref 96–112)
Creatinine, Ser: 0.93 mg/dL (ref 0.50–1.35)
Potassium: 3.9 mEq/L (ref 3.5–5.1)

## 2013-01-16 NOTE — Progress Notes (Signed)
   Subjective: 1 Day Post-Op Procedure(s) (LRB): OPEN REVISION PATELLA TENDON REPAIR RIGHT  (Right)   Patient reports pain as moderate, pain well controlled. Stated that he had difficulty sleeping last night, otherwise no events.  Objective:   VITALS:   Filed Vitals:   01/16/13 0625  BP: 125/83  Pulse: 62  Temp: 98.4 F (36.9 C)  Resp: 16    Neurovascular intact Dorsiflexion/Plantar flexion intact Incision: dressing C/D/I No cellulitis present Compartment soft  LABS  Recent Labs  01/15/13 1005 01/16/13 0451  HGB 15.7 13.2  HCT 46.9 40.9  WBC 5.5 5.7  PLT 133* 120*     Recent Labs  01/15/13 1005 01/16/13 0451  NA 140 138  K 4.0 3.9  BUN 12 9  CREATININE 0.86 0.93  GLUCOSE 99 112*     Assessment/Plan: 1 Day Post-Op Procedure(s) (LRB): OPEN REVISION PATELLA TENDON REPAIR RIGHT  (Right) Bledsoe brace to be placed today, locked in extension. Advance diet Up with therapy D/C IV fluids Discharge home with home health eventually when ready      [name]. Octavis Sheeler   PAC  01/16/2013, 8:55 AM

## 2013-01-16 NOTE — Progress Notes (Signed)
Utilization review completed.  

## 2013-01-16 NOTE — Clinical Documentation Improvement (Signed)
THIS DOCUMENT IS NOT A PERMANENT PART OF THE MEDICAL RECORD  Please update your documentation with the medical record to reflect your response to this query. If you need help knowing how to do this please call [phone].  01/16/13  Dear Dr. [facility],  In a better effort to capture your patient's severity of illness, reflect appropriate length of stay and utilization of resources, a review of the patient medical record has revealed the following indicators the diagnosis of Heart Failure.    Based on your clinical judgment, please clarify and document in a progress note and/or discharge summary the clinical condition associated with the following supporting information:  In responding to this query please exercise your independent judgment.  The fact that a query is asked, does not imply that any particular answer is desired or expected.  Pt with Grade I diastolic dysfunction  Clarification Needed   Please clarify if the "Grade I diastolic dysfunction" treated with Lasix can be further specified as one of the diagnoses listed below and document in pn or d/c summary.    Possible Clinical Conditions?  CHF  Chronic Systolic Congestive Heart Failure Chronic Diastolic Congestive Heart Failure Chronic Systolic & Diastolic Congestive Heart Failure Acute Systolic Congestive Heart Failure Acute Diastolic Congestive Heart Failure Acute Systolic & Diastolic Congestive Heart Failure Acute on Chronic Systolic Congestive Heart Failure Acute on Chronic Diastolic Congestive Heart Failure Acute on Chronic Systolic & Diastolic  Congestive Heart Failure Other Condition________________________________________ Cannot Clinically Determine  Supporting Information:  Risk Factors: Left Hip Perioprosthetic fracture Grade I diastolic dysfunction   Signs & Symptoms:  Diagnostics: Chest Xray 01/15/13  IMPRESSION: Central vascular congestion and mild interstitial prominence bilaterally  again noted.  Mild interstitial edema cannot be excluded.  Small right pleural effusion with right basilar atelectasis or infiltrate.  Bony thorax is stable.  Calcified granuloma in the right lower lobe again noted  Treatment: Lasix  Reviewed:  no additional documentation provided  That diagnosis has not been made, we will setup for follow up with medical doctor during the post-op period.  Thank You,  Enis Slipper  RN, BSN, MSN/Inf, CCDS Clinical Documentation Specialist [facility] Dept Pager: [phone] / E-mail: [email or web address]  [phone] Health Information Management [place]

## 2013-01-16 NOTE — Evaluation (Signed)
Physical Therapy Evaluation Patient Details Name: [name] [name] MRN: [id number] DOB: 1960/12/21 Today's Date: 01/16/2013 Time: 9629-5284 PT Time Calculation (min): 19 min  PT Assessment / Plan / Recommendation History of Present Illness     Clinical Impression  Pt s/p recurrent patellar tendon tear presents with functional mobility limitations 2* post op pain and restricted movements (full ext only) at R knee.    PT Assessment  Patient needs continued PT services    Follow Up Recommendations  No PT follow up    Does the patient have the potential to tolerate intense rehabilitation      Barriers to Discharge Other (comment) Pt states he cannot afford Pain meds    Equipment Recommendations  None recommended by PT    Recommendations for Other Services     Frequency 7X/week    Precautions / Restrictions Precautions Precautions: Fall Required Braces or Orthoses: Other Brace/Splint Other Brace/Splint: Bledsoe R LE Restrictions Weight Bearing Restrictions: No Other Position/Activity Restrictions: wbat   Pertinent Vitals/Pain 7-8/10; RN providing meds during session, cold packs provided      Mobility  Bed Mobility Bed Mobility: Supine to Sit;Sit to Supine Supine to Sit: 5: Supervision Sit to Supine: 5: Supervision Details for Bed Mobility Assistance: pt self assisting R LE with UEs Transfers Transfers: Sit to Stand;Stand to Sit Sit to Stand: 5: Supervision Stand to Sit: 5: Supervision Ambulation/Gait Ambulation/Gait Assistance: 5: Supervision Ambulation Distance (Feet): 123 Feet Assistive device: Rolling walker Ambulation/Gait Assistance Details: cues for position from RW Gait Pattern: Step-to pattern;Step-through pattern;Antalgic    Exercises     PT Diagnosis: Difficulty walking  PT Problem List: Decreased activity tolerance;Decreased strength;Decreased range of motion;Decreased mobility;Decreased knowledge of use of DME;Pain PT Treatment Interventions:  DME instruction;Gait training;Stair training;Functional mobility training;Therapeutic activities;Therapeutic exercise;Patient/family education     PT Goals(Current goals can be found in the care plan section) Acute Rehab PT Goals Patient Stated Goal: Home, get better, go back to work PT Goal Formulation: With patient/family Time For Goal Achievement: 01/19/13 Potential to Achieve Goals: Good  Visit Information  Last PT Received On: 01/16/13 Assistance Needed: +1       Prior Functioning  Home Living Family/patient expects to be discharged to:: Private residence Living Arrangements: Spouse/significant other Available Help at Discharge: Family;Friend(s) Type of Home: House Home Access: Stairs to enter Secretary/administrator of Steps: 1+1 Entrance Stairs-Rails: None Home Layout: One level Home Equipment: Environmental consultant - 2 wheels;Crutches Prior Function Level of Independence: Independent Comments: 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 Communication Communication: No difficulties Dominant Hand: Right    Cognition  Cognition Arousal/Alertness: Awake/alert Behavior During Therapy: WFL for tasks assessed/performed Overall Cognitive Status: Within Functional Limits for tasks assessed    Extremity/Trunk Assessment Upper Extremity Assessment Upper Extremity Assessment: Overall WFL for tasks assessed Lower Extremity Assessment Lower Extremity Assessment: RLE deficits/detail RLE Deficits / Details: Bledsoe in place RLE: Unable to fully assess due to immobilization   Balance    End of Session PT - End of Session Activity Tolerance: Patient tolerated treatment well Patient left: in bed;with call bell/phone within reach;with family/visitor present Nurse Communication: Mobility status  GP     [name] [name] 01/16/2013, 12:57 PM

## 2013-01-16 NOTE — Op Note (Signed)
NAME:  [name] [name], [name] [name]           ACCOUNT NO.:  [id number]  MEDICAL RECORD NO.:  [id number]  LOCATION:  1616                         FACILITY:  [facility]  PHYSICIAN:  Madlyn Frankel. Charlann Boxer, M.D.  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  PROCEDURE:  Re-repair of right patellar tendon with augmentation of a 16- gauge wire placed through the tibial tubercle and the patella in a cerclage fashion to provide enhanced stability to the repair.  SURGEON:  Madlyn Frankel. Charlann Boxer, M.D.  ASSISTANT:  Lanney Gins, PA-C.  Note that [name] [name] was present for the entirety of the case from preoperative position, perioperative management of operative extremity, general facilitation of the case and primary wound closure.  ANESTHESIA:  General plus regional femoral nerve block.  COMPLICATION:  None.  TOURNIQUET TIME:  About 70 minutes at 250 mmHg.  DRAINS:  None.  INDICATION FOR PROCEDURE:  [name] [name] is a 52 year old patient of mine since first addressing the patella fracture.  He had gone on to unite his patella fracture, presented to the office with the inferior pole patella rupture, which was then fixed with removal of hardware.  He had initially been doing well, but presented to the office recently with recurrent injury to the patellar tendon with defect and inability to perform quad extension.  I discussed with him the concerns I had about the potential for healing in this setting, the need for potential augmentation to support the healing  to prevent complication if there were any compliance issues. He states that he was being as compliance as possible utilizing the knee brace.  We discussed the risk of recurrent tear, loss of function of the lower extremity based on the injuries he has had so far as well as standard risks of infection, DVT, consent was obtained for the benefit of improved functionality particularly with knee extension.  PROCEDURE IN DETAIL:  The patient was brought to the operative theater. Once adequate anesthesia, preoperative antibiotics, Ancef administered, he was positioned supine with right thigh tourniquet placed.  The right lower extremity was then prepped and draped in sterile fashion.  The foot was placed in [facility] leg holder.  The leg was then exsanguinated and tourniquet elevated to 250 mmHg.  A time-out was performed to identifying the patient, planned procedure, and extremity.  Midline incision was made through previous incision and extended slightly proximal and distal for exposure purposes.  With the re-ruptured identified including retinacular disruptions, initially old sutures were removed.  I then evaluated the soft tissue situation.  He had a completely scar down patella adherent to the infrapatellar area within the intra-articular aspect of the knee.  I did make an arthrotomy-type incisions on the medial and lateral aspect of the patellar tendon along the proximal tibia and then by peeling  the patellar tendon distally, I then removed soft tissue to try to prevent some of the scarring in this area.  I did not affect the intra-articular aspect including the meniscus.  Following this, I debrided the end of the patella as well as the patellar tendon.  I then passed two #2 FiberWire sutures in a Krackow weave pattern into the patellar tendon, with 4 strands Alps of the proximal end.  I then recreated three drill holes into the tibia in a vertically oriented fashion, passing a single  suture medial, single suture lateral and the two central sutures through the central hole.  We then used a bone tenaculum and applying an inferior right distal based traction with the patella.  I then reapproximated the suture ends over the proximal pole of the patella.  I had made the decision based on the concern with compliance issues to augment this repair.  I then drilled the holes transversely underneath the tibial tubercle and then through the patella, passing a 16-gauge wire in a cerclage pattern to hold this repair in a bit more approximation to prevent complication or at least attempt.  Once this was tensioned down appropriately, I reapproximated the vertically based incisions for the suture repair on the proximal pole of the patella using #1 Vicryl.  I reapproximated the medial and lateral retinacular tears using #1 Vicryl as well as the medial and lateral base tibial incision through the extensor mechanism and retinacular tissue.  Once this was done, the remainder of the wound was closed with 2-0 Vicryl and running 3-0 Monocryl.  The knee was then cleaned, dried and dressed sterilely using Dermabond and an Aquacel dressing.  The knee was wrapped sterilely.  He was placed into a knee immobilizer.  We will plan to order a TROM-type brace, locked in extension.  I reviewed with his family after the case the possibility of needing long-leg cast and prevent any flexion.  He also will need to have this 16-gauge wire removed in approximately 6 week's time based on the potential healing of soft tissue to bone.  He was brought to the recovery room in stable condition following extubation.  He will be in the hospital in 1-2 days for pain management, stabilizing and expressing the concern regarding re-rupture of the tendon.     Madlyn Frankel Charlann Boxer, M.D.     MDO/MEDQ  D:  01/15/2013  T:  01/16/2013  Job:  [id number]

## 2013-01-17 LAB — BASIC METABOLIC PANEL
CO2: 29 mEq/L (ref 19–32)
Chloride: 100 mEq/L (ref 96–112)
Creatinine, Ser: 0.89 mg/dL (ref 0.50–1.35)
Glucose, Bld: 133 mg/dL — ABNORMAL HIGH (ref 70–99)
Sodium: 137 mEq/L (ref 135–145)

## 2013-01-17 LAB — CBC
Hemoglobin: 12.7 g/dL — ABNORMAL LOW (ref 13.0–17.0)
MCV: 90.8 fL (ref 78.0–100.0)
Platelets: 121 10*3/uL — ABNORMAL LOW (ref 150–400)
RBC: 4.24 MIL/uL (ref 4.22–5.81)
WBC: 7.2 10*3/uL (ref 4.0–10.5)

## 2013-01-17 MED ORDER — OXYCODONE HCL 5 MG PO TABS: 5.0000 mg | ORAL_TABLET | ORAL | Status: DC | PRN

## 2013-01-17 MED ORDER — FERROUS SULFATE 325 (65 FE) MG PO TABS: 325.0000 mg | ORAL_TABLET | Freq: Three times a day (TID) | ORAL | Status: DC

## 2013-01-17 MED ORDER — ASPIRIN 325 MG PO TBEC: 325.0000 mg | DELAYED_RELEASE_TABLET | Freq: Two times a day (BID) | ORAL | Status: AC

## 2013-01-17 MED ORDER — DSS 100 MG PO CAPS: 100.0000 mg | ORAL_CAPSULE | Freq: Two times a day (BID) | ORAL | Status: DC

## 2013-01-17 MED ORDER — POLYETHYLENE GLYCOL 3350 17 G PO PACK: 17.0000 g | PACK | Freq: Two times a day (BID) | ORAL | Status: DC

## 2013-01-17 MED ORDER — METHOCARBAMOL 500 MG PO TABS: 500.0000 mg | ORAL_TABLET | Freq: Four times a day (QID) | ORAL | Status: DC | PRN

## 2013-01-17 NOTE — Progress Notes (Signed)
[facility]  [facility] is providing the following services: Patient already has RW and commode at home.   If patient discharges after hours, please call [phone].   Renard Hamper 01/17/2013, 7:36 AM

## 2013-01-17 NOTE — Progress Notes (Signed)
OT Cancellation Note  Patient Details Name: [name] [name] MRN: [id number] DOB: [birth year]   Cancelled Treatment:    Reason Eval/Treat Not Completed: PT screened, no needs identified, will sign off  [name] 01/17/2013, 9:15 AM [name], OTR/L [phone] 01/17/2013

## 2013-01-17 NOTE — Progress Notes (Signed)
CARE MANAGEMENT NOTE 01/17/2013  Patient:  [name] [name], [name] [name]   Account Number:  [id number]  Date Initiated:  01/17/2013  Documentation initiated by:  [facility]  Subjective/Objective Assessment:   52 year old male admitted s/p right open revision tendon repair.     Action/Plan:   From home.   Anticipated DC Date:  01/17/2013   Anticipated DC Plan:  HOME/SELF CARE    DC Planning Services  CM consult          Status of service:  Completed, signed off  Medicare Important Message given?  NA - LOS <3 / Initial given by admissions    Discharge Disposition:  HOME/SELF CARE  Per UR Regulation:  Reviewed for med. necessity/level of care/duration of stay    Comments:  01/17/13 Algernon Huxley RN BSN No home health services needs. I was however called to assit pt with medications. Charge nurse called [facility] outpatient and was informed the medication is $ 9.00. Pt informed me he can afford this and does not need assistance at this time.

## 2013-01-17 NOTE — Progress Notes (Signed)
   Subjective: 2 Days Post-Op Procedure(s) (LRB): OPEN REVISION PATELLA TENDON REPAIR RIGHT  (Right)   Patient reports pain as mild, pain well controlled. No events throughout the night. Ready to be discharged home.  Objective:   VITALS:   Filed Vitals:   01/17/13 0547  BP: 150/82  Pulse: 76  Temp: 98.6 F (37 C)  Resp: 16    Sensation intact distally Dorsiflexion/Plantar flexion intact Incision: dressing C/D/I No cellulitis present Compartment soft  LABS  Recent Labs  01/15/13 1005 01/16/13 0451 01/17/13 0438  HGB 15.7 13.2 12.7*  HCT 46.9 40.9 38.5*  WBC 5.5 5.7 7.2  PLT 133* 120* 121*     Recent Labs  01/15/13 1005 01/16/13 0451 01/17/13 0438  NA 140 138 137  K 4.0 3.9 4.3  BUN 12 9 13   CREATININE 0.86 0.93 0.89  GLUCOSE 99 112* 133*     Assessment/Plan: 2 Days Post-Op Procedure(s) (LRB): OPEN REVISION PATELLA TENDON REPAIR RIGHT  (Right) Up with therapy Discharge home with home health Follow up in 2 weeks at [facility]. Follow up with OLIN,Zyaira Vejar D in 2 weeks.  Contact information:  [facility] [address], [address] [place] [address] [phone]        [name]. Versa Craton   PAC  01/17/2013, 2:01 PM
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
## 2013-04-25 ENCOUNTER — Emergency Department (HOSPITAL_BASED_OUTPATIENT_CLINIC_OR_DEPARTMENT_OTHER)
Admission: EM | Admit: 2013-04-25 | Discharge: 2013-04-25 | Disposition: A | Discharge status: Home / Self Care | Payer: Self-pay | Attending: Emergency Medicine | Admitting: Emergency Medicine

## 2013-04-25 ENCOUNTER — Emergency Department (HOSPITAL_BASED_OUTPATIENT_CLINIC_OR_DEPARTMENT_OTHER): Payer: Self-pay

## 2013-04-25 ENCOUNTER — Encounter (HOSPITAL_BASED_OUTPATIENT_CLINIC_OR_DEPARTMENT_OTHER): Payer: Self-pay | Admitting: Emergency Medicine

## 2013-04-25 DIAGNOSIS — D649 Anemia, unspecified: Secondary | ICD-10-CM | POA: Insufficient documentation

## 2013-04-25 DIAGNOSIS — M25561 Pain in right knee: Secondary | ICD-10-CM

## 2013-04-25 DIAGNOSIS — M129 Arthropathy, unspecified: Secondary | ICD-10-CM | POA: Insufficient documentation

## 2013-04-25 DIAGNOSIS — Z8669 Personal history of other diseases of the nervous system and sense organs: Secondary | ICD-10-CM | POA: Insufficient documentation

## 2013-04-25 DIAGNOSIS — F17200 Nicotine dependence, unspecified, uncomplicated: Secondary | ICD-10-CM | POA: Insufficient documentation

## 2013-04-25 DIAGNOSIS — Z79899 Other long term (current) drug therapy: Secondary | ICD-10-CM | POA: Insufficient documentation

## 2013-04-25 DIAGNOSIS — M25469 Effusion, unspecified knee: Secondary | ICD-10-CM | POA: Insufficient documentation

## 2013-04-25 DIAGNOSIS — M25569 Pain in unspecified knee: Secondary | ICD-10-CM | POA: Insufficient documentation

## 2013-04-25 DIAGNOSIS — G8929 Other chronic pain: Secondary | ICD-10-CM | POA: Insufficient documentation

## 2013-04-25 DIAGNOSIS — Z9889 Other specified postprocedural states: Secondary | ICD-10-CM | POA: Insufficient documentation

## 2013-04-25 MED ORDER — NAPROXEN 500 MG PO TABS: 500.0000 mg | ORAL_TABLET | Freq: Two times a day (BID) | ORAL | Status: DC
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
## 2013-04-25 NOTE — ED Notes (Signed)
Right knee pain and stiffness. Hx of knee surgery in the past. From [facility]. Cannot have narcotics.

## 2013-05-03 ENCOUNTER — Emergency Department (HOSPITAL_BASED_OUTPATIENT_CLINIC_OR_DEPARTMENT_OTHER)
Admission: EM | Admit: 2013-05-03 | Discharge: 2013-05-03 | Disposition: A | Discharge status: Home / Self Care | Payer: Medicaid Other | Attending: Emergency Medicine | Admitting: Emergency Medicine

## 2013-05-03 ENCOUNTER — Encounter (HOSPITAL_BASED_OUTPATIENT_CLINIC_OR_DEPARTMENT_OTHER): Payer: Self-pay | Admitting: Emergency Medicine

## 2013-05-03 DIAGNOSIS — M129 Arthropathy, unspecified: Secondary | ICD-10-CM | POA: Insufficient documentation

## 2013-05-03 DIAGNOSIS — Z79899 Other long term (current) drug therapy: Secondary | ICD-10-CM | POA: Insufficient documentation

## 2013-05-03 DIAGNOSIS — F17200 Nicotine dependence, unspecified, uncomplicated: Secondary | ICD-10-CM | POA: Insufficient documentation

## 2013-05-03 DIAGNOSIS — K0889 Other specified disorders of teeth and supporting structures: Secondary | ICD-10-CM

## 2013-05-03 DIAGNOSIS — K029 Dental caries, unspecified: Secondary | ICD-10-CM | POA: Insufficient documentation

## 2013-05-03 DIAGNOSIS — D649 Anemia, unspecified: Secondary | ICD-10-CM | POA: Insufficient documentation

## 2013-05-03 DIAGNOSIS — K089 Disorder of teeth and supporting structures, unspecified: Secondary | ICD-10-CM | POA: Insufficient documentation

## 2013-05-03 DIAGNOSIS — Z8669 Personal history of other diseases of the nervous system and sense organs: Secondary | ICD-10-CM | POA: Insufficient documentation

## 2013-05-03 DIAGNOSIS — Z791 Long term (current) use of non-steroidal anti-inflammatories (NSAID): Secondary | ICD-10-CM | POA: Insufficient documentation

## 2013-05-03 MED ORDER — IBUPROFEN 800 MG PO TABS: 800.0000 mg | ORAL_TABLET | Freq: Three times a day (TID) | ORAL | Status: DC

## 2013-05-03 MED ORDER — AMOXICILLIN 500 MG PO CAPS: 500.0000 mg | ORAL_CAPSULE | Freq: Three times a day (TID) | ORAL | Status: AC
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
## 2013-05-03 NOTE — ED Notes (Signed)
Toothache upper left molar

## 2013-05-05 NOTE — ED Provider Notes (Signed)
Medical screening examination/treatment/procedure(s) were performed by non-physician practitioner and as supervising physician I was immediately available for consultation/collaboration.   Nasiir Monts L Jeliyah Middlebrooks, MD 05/05/13 1100 

## 2013-05-07 IMAGING — CR DG KNEE COMPLETE 4+V*R*
4 series · 4 of 4 positions shown · non-contrast
Comparison: Radiographs dated 09/04/2012 and 06/08/2006

CLINICAL DATA: Right knee pain and swelling secondary to a fall
today. Patellar fracture on 09/04/2012.

RIGHT KNEE - COMPLETE 4+ VIEW

[t knee ap right]
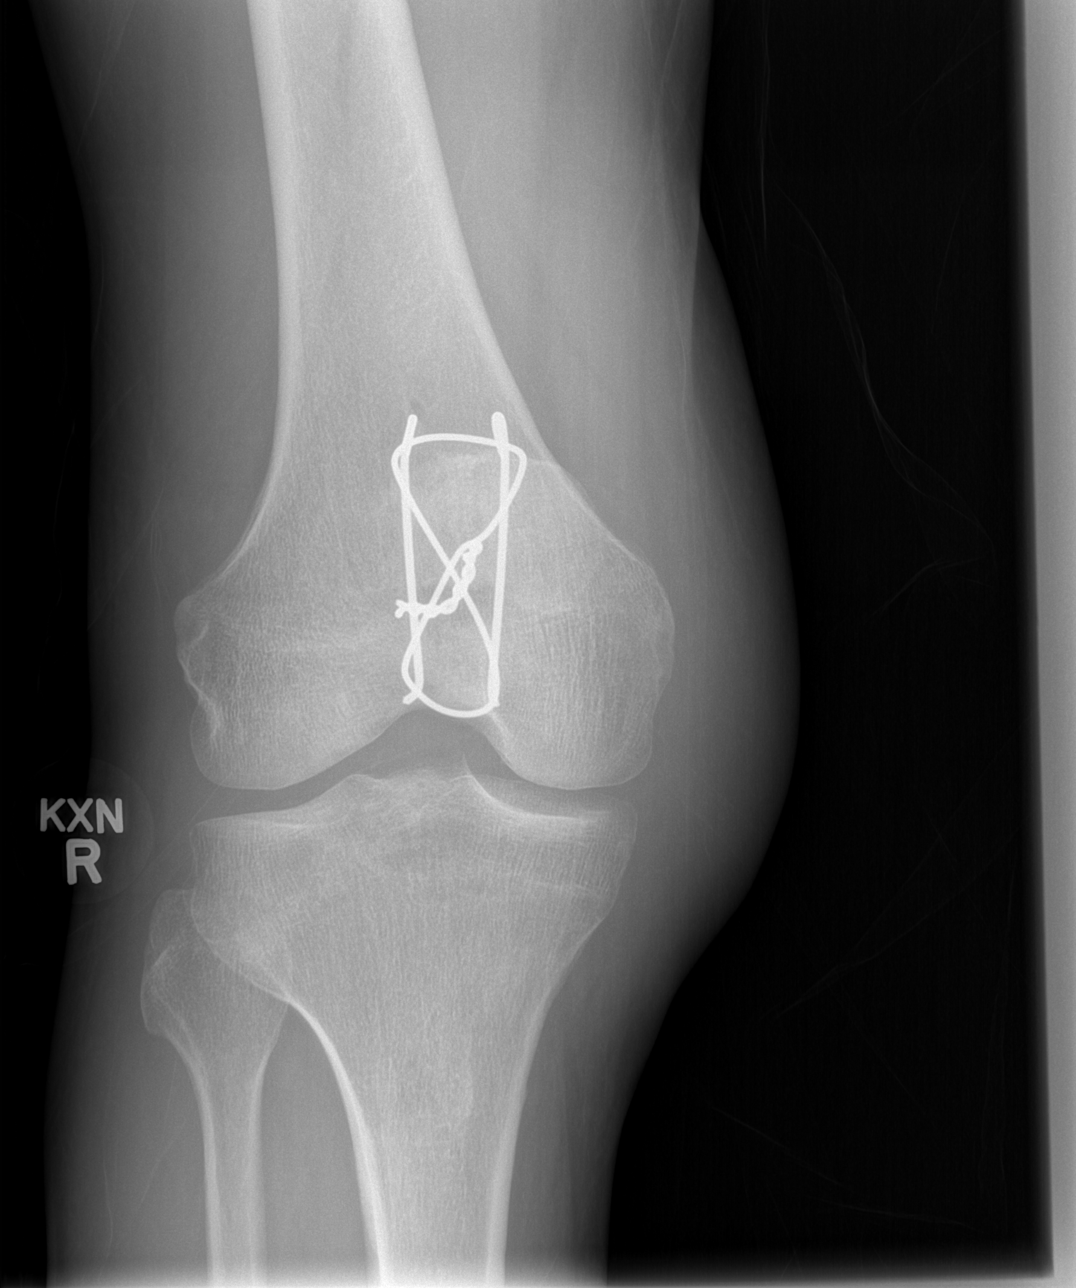

[t knee oblique right (1 of 2)]
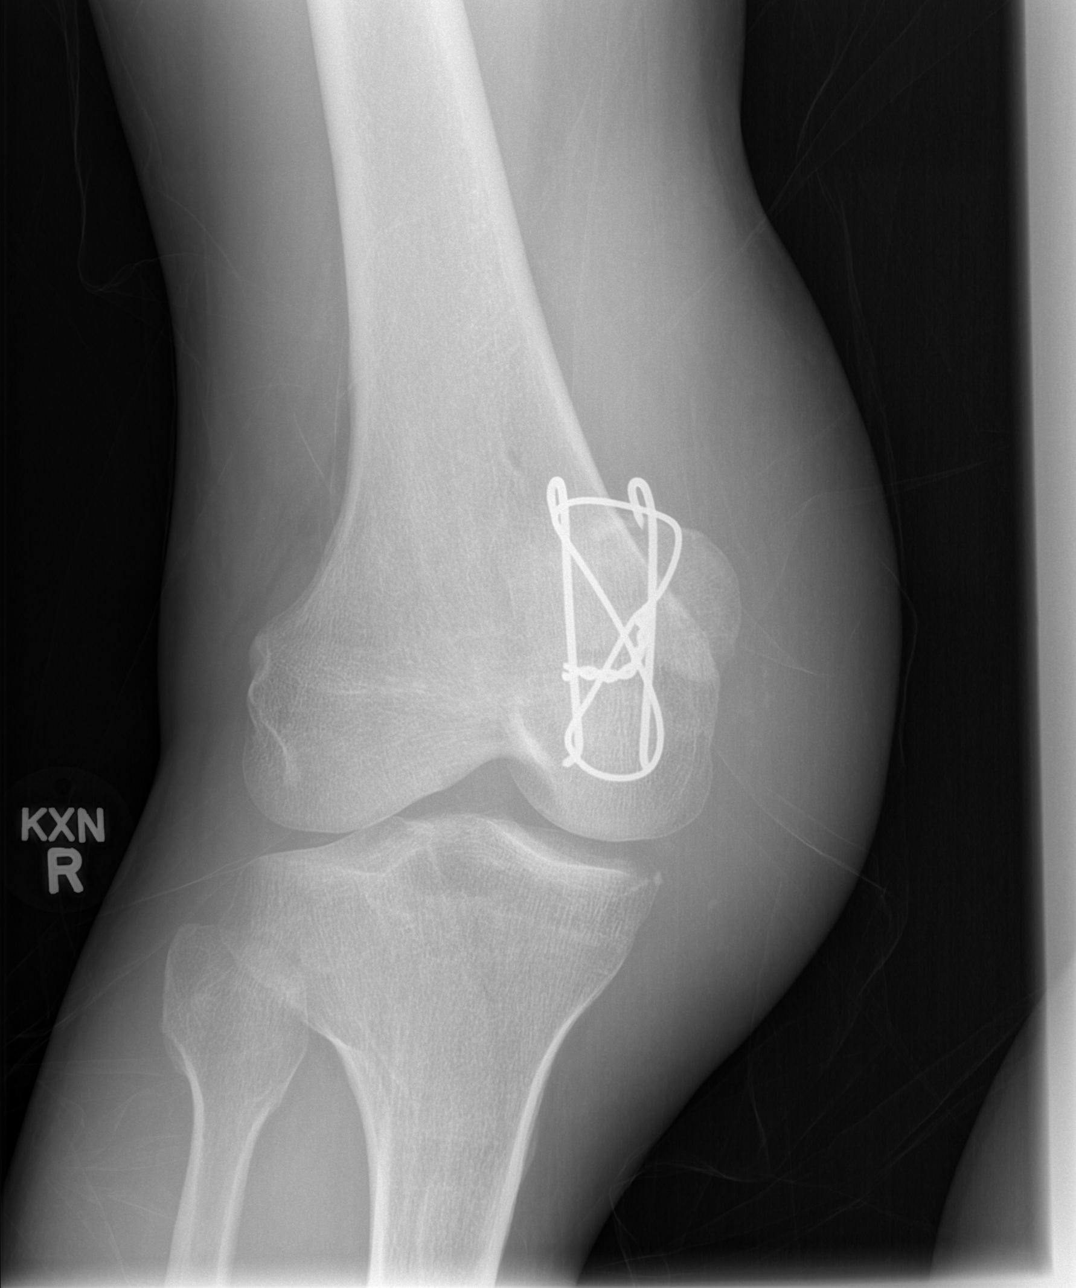

[t knee oblique right (2 of 2)]
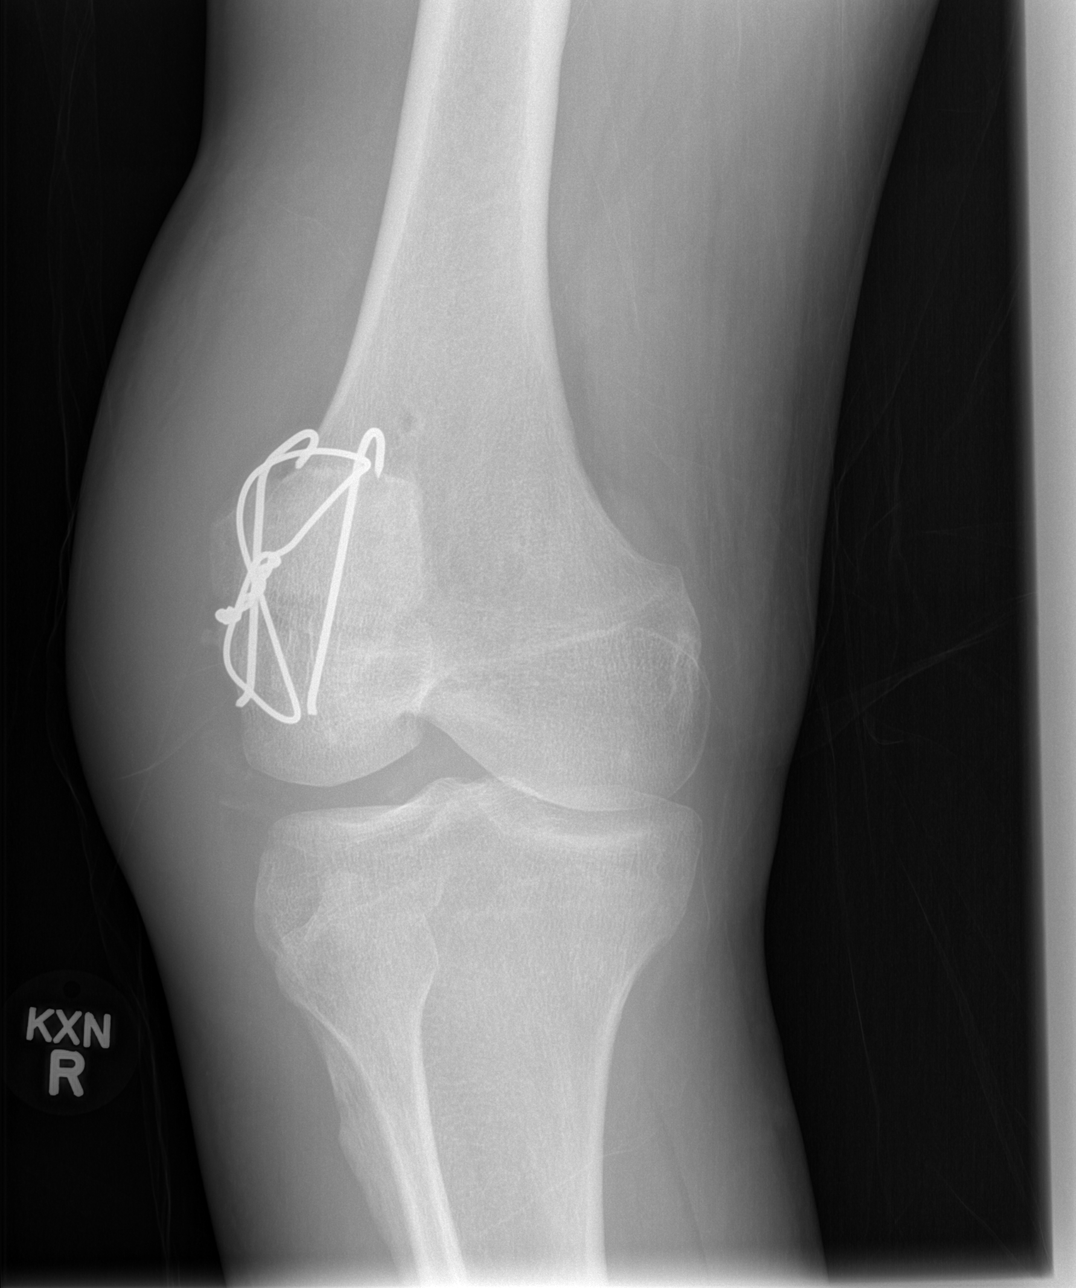

[t knee lat right]
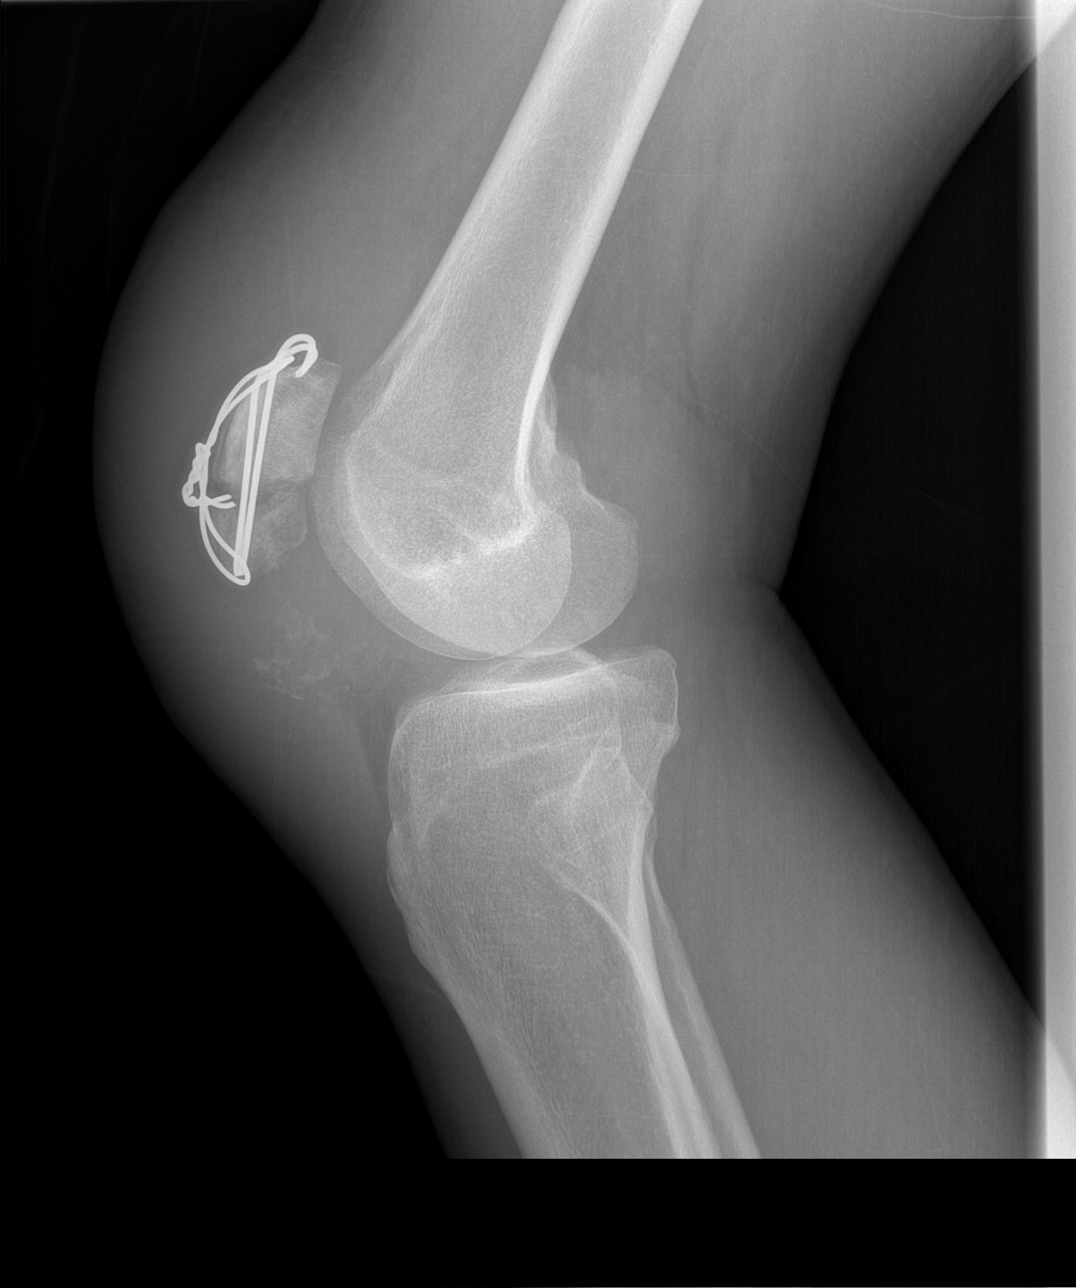

[4 of 4 positions shown; findings below may reference images not displayed]

FINDINGS: The patellar fracture has not yet healed.  There is
amorphous calcification in the region of the patellar tendon 2 cm
below the lower pole of the patella.  This may represent dystrophic
calcification secondary to the prior injury.

There is marked soft tissue swelling over the distal quadriceps
tendon, patella, and patellar tendon.  There is a knee joint
effusion.  The femur, tibia, and fibula appear normal at the knee
joint.
IMPRESSION: 1.  Marked soft tissue swelling anteriorly.
2.  The patella fracture has not yet healed.
3.  Dystrophic calcification in the region of the patellar tendon
is probably associated with the prior injury.

## 2013-05-09 ENCOUNTER — Other Ambulatory Visit: Payer: Self-pay

## 2014-10-20 ENCOUNTER — Encounter (HOSPITAL_COMMUNITY): Payer: Self-pay | Admitting: Emergency Medicine

## 2014-10-20 DIAGNOSIS — Z8739 Personal history of other diseases of the musculoskeletal system and connective tissue: Secondary | ICD-10-CM | POA: Insufficient documentation

## 2014-10-20 DIAGNOSIS — Z79899 Other long term (current) drug therapy: Secondary | ICD-10-CM | POA: Insufficient documentation

## 2014-10-20 DIAGNOSIS — J40 Bronchitis, not specified as acute or chronic: Secondary | ICD-10-CM | POA: Insufficient documentation

## 2014-10-20 DIAGNOSIS — Z720 Tobacco use: Secondary | ICD-10-CM | POA: Insufficient documentation

## 2014-10-20 DIAGNOSIS — Z791 Long term (current) use of non-steroidal anti-inflammatories (NSAID): Secondary | ICD-10-CM | POA: Insufficient documentation

## 2014-10-20 DIAGNOSIS — D649 Anemia, unspecified: Secondary | ICD-10-CM | POA: Insufficient documentation

## 2014-10-20 DIAGNOSIS — Z8669 Personal history of other diseases of the nervous system and sense organs: Secondary | ICD-10-CM | POA: Insufficient documentation

## 2014-10-20 NOTE — ED Notes (Signed)
Pt. reports persistent dry cough with chest congestion , fatigue and body aches . Denies fever or chills.

## 2014-10-21 ENCOUNTER — Emergency Department (HOSPITAL_COMMUNITY)
Admission: EM | Admit: 2014-10-21 | Discharge: 2014-10-21 | Disposition: A | Discharge status: Home / Self Care | Payer: Medicaid Other | Attending: Emergency Medicine | Admitting: Emergency Medicine

## 2014-10-21 ENCOUNTER — Emergency Department (HOSPITAL_COMMUNITY): Payer: Medicaid Other

## 2014-10-21 DIAGNOSIS — R059 Cough, unspecified: Secondary | ICD-10-CM

## 2014-10-21 DIAGNOSIS — J209 Acute bronchitis, unspecified: Secondary | ICD-10-CM

## 2014-10-21 DIAGNOSIS — R0989 Other specified symptoms and signs involving the circulatory and respiratory systems: Secondary | ICD-10-CM

## 2014-10-21 DIAGNOSIS — R05 Cough: Secondary | ICD-10-CM

## 2014-10-21 LAB — CBC WITH DIFFERENTIAL/PLATELET
BASOS ABS: 0 10*3/uL (ref 0.0–0.1)
BASOS PCT: 0 % (ref 0–1)
EOS ABS: 0 10*3/uL (ref 0.0–0.7)
EOS PCT: 0 % (ref 0–5)
HEMATOCRIT: 44.8 % (ref 39.0–52.0)
Hemoglobin: 15.4 g/dL (ref 13.0–17.0)
Lymphocytes Relative: 26 % (ref 12–46)
Lymphs Abs: 1.9 10*3/uL (ref 0.7–4.0)
MCH: 32 pg (ref 26.0–34.0)
MCHC: 34.4 g/dL (ref 30.0–36.0)
MCV: 92.9 fL (ref 78.0–100.0)
Monocytes Absolute: 0.3 10*3/uL (ref 0.1–1.0)
Monocytes Relative: 4 % (ref 3–12)
NEUTROS ABS: 5 10*3/uL (ref 1.7–7.7)
NEUTROS PCT: 69 % (ref 43–77)
PLATELETS: 112 10*3/uL — AB (ref 150–400)
RBC: 4.82 MIL/uL (ref 4.22–5.81)
RDW: 14.3 % (ref 11.5–15.5)
WBC: 7.3 10*3/uL (ref 4.0–10.5)

## 2014-10-21 LAB — BASIC METABOLIC PANEL
Anion gap: 10 (ref 5–15)
BUN: 17 mg/dL (ref 6–23)
CALCIUM: 9.4 mg/dL (ref 8.4–10.5)
CO2: 25 mmol/L (ref 19–32)
Chloride: 105 mmol/L (ref 96–112)
Creatinine, Ser: 1.03 mg/dL (ref 0.50–1.35)
GFR calc Af Amer: >90 mL/min (ref >90)
GFR, EST NON AFRICAN AMERICAN: 80 mL/min — AB (ref >90)
GLUCOSE: 114 mg/dL — AB (ref 70–99)
Potassium: 4.3 mmol/L (ref 3.5–5.1)
SODIUM: 140 mmol/L (ref 135–145)

## 2014-10-21 MED ORDER — PREDNISONE 20 MG PO TABS
60.0000 mg | ORAL_TABLET | Freq: Once | ORAL | Status: AC
  Administered 2014-10-21: 60 mg via ORAL
  Filled 2014-10-21: qty 3

## 2014-10-21 MED ORDER — ALBUTEROL SULFATE (2.5 MG/3ML) 0.083% IN NEBU
5.0000 mg | INHALATION_SOLUTION | Freq: Once | RESPIRATORY_TRACT | Status: AC
  Administered 2014-10-21: 5 mg via RESPIRATORY_TRACT
  Filled 2014-10-21: qty 6

## 2014-10-21 MED ORDER — ALBUTEROL (5 MG/ML) CONTINUOUS INHALATION SOLN
10.0000 mg/h | INHALATION_SOLUTION | Freq: Once | RESPIRATORY_TRACT | Status: AC
  Administered 2014-10-21: 10 mg/h via RESPIRATORY_TRACT

## 2014-10-21 MED ORDER — ALBUTEROL SULFATE HFA 108 (90 BASE) MCG/ACT IN AERS
2.0000 | INHALATION_SPRAY | Freq: Once | RESPIRATORY_TRACT | Status: AC
  Administered 2014-10-21: 2 via RESPIRATORY_TRACT
  Filled 2014-10-21: qty 6.7

## 2014-10-21 MED ORDER — ALBUTEROL (5 MG/ML) CONTINUOUS INHALATION SOLN
INHALATION_SOLUTION | RESPIRATORY_TRACT | Status: AC
  Filled 2014-10-21: qty 20

## 2014-10-21 MED ORDER — IPRATROPIUM BROMIDE 0.02 % IN SOLN
0.5000 mg | Freq: Once | RESPIRATORY_TRACT | Status: AC
  Administered 2014-10-21: 0.5 mg via RESPIRATORY_TRACT
  Filled 2014-10-21: qty 2.5

## 2014-10-21 MED ORDER — PREDNISONE 50 MG PO TABS: ORAL_TABLET | ORAL | Status: DC

## 2014-10-21 NOTE — ED Notes (Signed)
SpO2 at 90 while ambulating. Patient complained of dizziness. No pain.

## 2014-10-21 NOTE — ED Notes (Signed)
Pt st's he has been sick x's 2 days.  Unable to lay flat at night to sleep due to shortness of breath.   St's he ate earlier tonight but coughed until he vomited up his food.
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
## 2014-10-21 NOTE — ED Notes (Signed)
Pt. ambulated , respirations unlabored, O2 sat= 94% , occasional dry cough , EDP explained discharge plan to pt.

## 2014-10-21 NOTE — Discharge Instructions (Signed)

## 2019-11-16 ENCOUNTER — Other Ambulatory Visit: Payer: Self-pay

## 2019-11-16 DIAGNOSIS — J984 Other disorders of lung: Secondary | ICD-10-CM | POA: Insufficient documentation

## 2019-11-16 DIAGNOSIS — M4802 Spinal stenosis, cervical region: Secondary | ICD-10-CM | POA: Insufficient documentation

## 2019-11-16 DIAGNOSIS — F17210 Nicotine dependence, cigarettes, uncomplicated: Secondary | ICD-10-CM | POA: Insufficient documentation

## 2019-11-16 DIAGNOSIS — S27321A Contusion of lung, unilateral, initial encounter: Secondary | ICD-10-CM | POA: Insufficient documentation

## 2019-11-16 DIAGNOSIS — F1290 Cannabis use, unspecified, uncomplicated: Secondary | ICD-10-CM | POA: Insufficient documentation

## 2019-11-16 DIAGNOSIS — Z20822 Contact with and (suspected) exposure to covid-19: Secondary | ICD-10-CM | POA: Insufficient documentation

## 2019-11-16 DIAGNOSIS — M1990 Unspecified osteoarthritis, unspecified site: Secondary | ICD-10-CM | POA: Insufficient documentation

## 2019-11-16 DIAGNOSIS — N400 Enlarged prostate without lower urinary tract symptoms: Secondary | ICD-10-CM | POA: Insufficient documentation

## 2019-11-16 DIAGNOSIS — F1010 Alcohol abuse, uncomplicated: Secondary | ICD-10-CM | POA: Insufficient documentation

## 2019-11-16 DIAGNOSIS — S42112A Displaced fracture of body of scapula, left shoulder, initial encounter for closed fracture: Principal | ICD-10-CM | POA: Insufficient documentation

## 2019-11-17 ENCOUNTER — Observation Stay (HOSPITAL_COMMUNITY)
Admission: EM | Admit: 2019-11-17 | Discharge: 2019-11-18 | Disposition: A | Discharge status: Home / Self Care | Payer: Self-pay | Attending: Emergency Medicine | Admitting: Emergency Medicine

## 2019-11-17 ENCOUNTER — Emergency Department (HOSPITAL_COMMUNITY): Payer: Self-pay

## 2019-11-17 ENCOUNTER — Other Ambulatory Visit: Payer: Self-pay

## 2019-11-17 ENCOUNTER — Encounter (HOSPITAL_COMMUNITY): Payer: Self-pay | Admitting: Emergency Medicine

## 2019-11-17 DIAGNOSIS — S42112A Displaced fracture of body of scapula, left shoulder, initial encounter for closed fracture: Secondary | ICD-10-CM

## 2019-11-17 DIAGNOSIS — J984 Other disorders of lung: Secondary | ICD-10-CM

## 2019-11-17 DIAGNOSIS — S27321A Contusion of lung, unilateral, initial encounter: Secondary | ICD-10-CM

## 2019-11-17 LAB — BASIC METABOLIC PANEL
Anion gap: 12 (ref 5–15)
BUN: 16 mg/dL (ref 6–20)
CO2: 24 mmol/L (ref 22–32)
Calcium: 9.3 mg/dL (ref 8.9–10.3)
Chloride: 107 mmol/L (ref 98–111)
Creatinine, Ser: 1.09 mg/dL (ref 0.61–1.24)
GFR calc Af Amer: >60 mL/min (ref >60)
GFR calc non Af Amer: >60 mL/min (ref >60)
Glucose, Bld: 120 mg/dL — ABNORMAL HIGH (ref 70–99)
Potassium: 4.3 mmol/L (ref 3.5–5.1)
Sodium: 143 mmol/L (ref 135–145)

## 2019-11-17 LAB — CBC WITH DIFFERENTIAL/PLATELET
Abs Immature Granulocytes: 0.02 10*3/uL (ref 0.00–0.07)
Basophils Absolute: 0 10*3/uL (ref 0.0–0.1)
Basophils Relative: 0 %
Eosinophils Absolute: 0 10*3/uL (ref 0.0–0.5)
Eosinophils Relative: 0 %
HCT: 45.5 % (ref 39.0–52.0)
Hemoglobin: 14.9 g/dL (ref 13.0–17.0)
Immature Granulocytes: 0 %
Lymphocytes Relative: 20 %
Lymphs Abs: 1.4 10*3/uL (ref 0.7–4.0)
MCH: 30.3 pg (ref 26.0–34.0)
MCHC: 32.7 g/dL (ref 30.0–36.0)
MCV: 92.7 fL (ref 80.0–100.0)
Monocytes Absolute: 0.3 10*3/uL (ref 0.1–1.0)
Monocytes Relative: 4 %
Neutro Abs: 5.2 10*3/uL (ref 1.7–7.7)
Neutrophils Relative %: 76 %
Platelets: 123 10*3/uL — ABNORMAL LOW (ref 150–400)
RBC: 4.91 MIL/uL (ref 4.22–5.81)
RDW: 14.5 % (ref 11.5–15.5)
WBC: 7 10*3/uL (ref 4.0–10.5)
nRBC: 0 % (ref 0.0–0.2)

## 2019-11-17 LAB — CBC
HCT: 45.2 % (ref 39.0–52.0)
Hemoglobin: 15.1 g/dL (ref 13.0–17.0)
MCH: 30.7 pg (ref 26.0–34.0)
MCHC: 33.4 g/dL (ref 30.0–36.0)
MCV: 91.9 fL (ref 80.0–100.0)
Platelets: 136 10*3/uL — ABNORMAL LOW (ref 150–400)
RBC: 4.92 MIL/uL (ref 4.22–5.81)
RDW: 14.6 % (ref 11.5–15.5)
WBC: 6.4 10*3/uL (ref 4.0–10.5)
nRBC: 0 % (ref 0.0–0.2)

## 2019-11-17 LAB — ETHANOL: Alcohol, Ethyl (B): <10 mg/dL (ref <10)

## 2019-11-17 LAB — CREATININE, SERUM
Creatinine, Ser: 1.08 mg/dL (ref 0.61–1.24)
GFR calc Af Amer: >60 mL/min (ref >60)
GFR calc non Af Amer: >60 mL/min (ref >60)

## 2019-11-17 LAB — HIV ANTIBODY (ROUTINE TESTING W REFLEX): HIV Screen 4th Generation wRfx: NONREACTIVE

## 2019-11-17 LAB — SARS CORONAVIRUS 2 (TAT 6-24 HRS): SARS Coronavirus 2: NEGATIVE

## 2019-11-17 MED ORDER — FOLIC ACID 1 MG PO TABS
1.0000 mg | ORAL_TABLET | Freq: Every day | ORAL | Status: DC
  Administered 2019-11-17 – 2019-11-18 (×2): 1 mg via ORAL
  Filled 2019-11-17 (×2): qty 1

## 2019-11-17 MED ORDER — NICOTINE 21 MG/24HR TD PT24
21.0000 mg | MEDICATED_PATCH | Freq: Every day | TRANSDERMAL | Status: DC
  Administered 2019-11-17 – 2019-11-18 (×2): 21 mg via TRANSDERMAL
  Filled 2019-11-17 (×2): qty 1

## 2019-11-17 MED ORDER — ENOXAPARIN SODIUM 40 MG/0.4ML ~~LOC~~ SOLN
40.0000 mg | SUBCUTANEOUS | Status: DC
  Administered 2019-11-17 – 2019-11-18 (×2): 40 mg via SUBCUTANEOUS
  Filled 2019-11-17 (×2): qty 0.4

## 2019-11-17 MED ORDER — METOPROLOL TARTRATE 5 MG/5ML IV SOLN: 5.0000 mg | Freq: Four times a day (QID) | INTRAVENOUS | Status: DC | PRN

## 2019-11-17 MED ORDER — SODIUM CHLORIDE 0.9% FLUSH
3.0000 mL | INTRAVENOUS | Status: DC | PRN
  Administered 2019-11-17: 3 mL via INTRAVENOUS

## 2019-11-17 MED ORDER — ONDANSETRON 4 MG PO TBDP: 4.0000 mg | ORAL_TABLET | Freq: Four times a day (QID) | ORAL | Status: DC | PRN

## 2019-11-17 MED ORDER — IOHEXOL 300 MG/ML  SOLN
100.0000 mL | Freq: Once | INTRAMUSCULAR | Status: AC | PRN
  Administered 2019-11-17: 100 mL via INTRAVENOUS

## 2019-11-17 MED ORDER — ADULT MULTIVITAMIN W/MINERALS CH
1.0000 | ORAL_TABLET | Freq: Every day | ORAL | Status: DC
  Administered 2019-11-17 – 2019-11-18 (×2): 1 via ORAL
  Filled 2019-11-17 (×2): qty 1

## 2019-11-17 MED ORDER — ACETAMINOPHEN 325 MG PO TABS
650.0000 mg | ORAL_TABLET | Freq: Four times a day (QID) | ORAL | Status: DC
  Administered 2019-11-17 – 2019-11-18 (×3): 650 mg via ORAL
  Filled 2019-11-17 (×3): qty 2

## 2019-11-17 MED ORDER — LORAZEPAM 2 MG/ML IJ SOLN
1.0000 mg | INTRAMUSCULAR | Status: DC | PRN
  Administered 2019-11-17: 1 mg via INTRAVENOUS
  Filled 2019-11-17: qty 1

## 2019-11-17 MED ORDER — PANTOPRAZOLE SODIUM 40 MG PO TBEC
40.0000 mg | DELAYED_RELEASE_TABLET | Freq: Every day | ORAL | Status: DC
  Administered 2019-11-17 – 2019-11-18 (×2): 40 mg via ORAL
  Filled 2019-11-17 (×2): qty 1

## 2019-11-17 MED ORDER — LORAZEPAM 1 MG PO TABS: 1.0000 mg | ORAL_TABLET | ORAL | Status: DC | PRN

## 2019-11-17 MED ORDER — ONDANSETRON HCL 4 MG/2ML IJ SOLN: 4.0000 mg | Freq: Four times a day (QID) | INTRAMUSCULAR | Status: DC | PRN

## 2019-11-17 MED ORDER — THIAMINE HCL 100 MG/ML IJ SOLN: 100.0000 mg | Freq: Every day | INTRAMUSCULAR | Status: DC

## 2019-11-17 MED ORDER — SODIUM CHLORIDE 0.9% FLUSH
3.0000 mL | Freq: Two times a day (BID) | INTRAVENOUS | Status: DC
  Administered 2019-11-17 – 2019-11-18 (×3): 3 mL via INTRAVENOUS

## 2019-11-17 MED ORDER — FENTANYL CITRATE (PF) 100 MCG/2ML IJ SOLN
100.0000 ug | Freq: Once | INTRAMUSCULAR | Status: AC
  Administered 2019-11-17: 100 ug via INTRAVENOUS
  Filled 2019-11-17: qty 2

## 2019-11-17 MED ORDER — THIAMINE HCL 100 MG PO TABS
100.0000 mg | ORAL_TABLET | Freq: Every day | ORAL | Status: DC
  Administered 2019-11-17 – 2019-11-18 (×2): 100 mg via ORAL
  Filled 2019-11-17 (×2): qty 1

## 2019-11-17 MED ORDER — DOCUSATE SODIUM 100 MG PO CAPS
100.0000 mg | ORAL_CAPSULE | Freq: Two times a day (BID) | ORAL | Status: DC
  Administered 2019-11-17 – 2019-11-18 (×2): 100 mg via ORAL
  Filled 2019-11-17 (×3): qty 1

## 2019-11-17 MED ORDER — IPRATROPIUM-ALBUTEROL 0.5-2.5 (3) MG/3ML IN SOLN: 3.0000 mL | RESPIRATORY_TRACT | Status: DC | PRN

## 2019-11-17 MED ORDER — SODIUM CHLORIDE 0.9 % IV SOLN: 250.0000 mL | INTRAVENOUS | Status: DC | PRN

## 2019-11-17 MED ORDER — OXYCODONE HCL 5 MG PO TABS
5.0000 mg | ORAL_TABLET | ORAL | Status: DC | PRN
  Administered 2019-11-17 – 2019-11-18 (×5): 10 mg via ORAL
  Filled 2019-11-17 (×6): qty 2

## 2019-11-17 MED ORDER — METHOCARBAMOL 500 MG PO TABS
500.0000 mg | ORAL_TABLET | Freq: Three times a day (TID) | ORAL | Status: DC
  Administered 2019-11-17 (×3): 500 mg via ORAL
  Filled 2019-11-17 (×4): qty 1

## 2019-11-17 MED ORDER — HYDROMORPHONE HCL 1 MG/ML IJ SOLN
0.5000 mg | INTRAMUSCULAR | Status: DC | PRN
  Administered 2019-11-17 (×2): 0.5 mg via INTRAVENOUS
  Filled 2019-11-17 (×2): qty 1

## 2019-11-17 MED ORDER — PANTOPRAZOLE SODIUM 40 MG IV SOLR: 40.0000 mg | Freq: Every day | INTRAVENOUS | Status: DC
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
## 2019-11-17 NOTE — ED Notes (Signed)
Attempted to call nursing report to 6N 

## 2019-11-17 NOTE — ED Notes (Signed)
Breakfast tray ordered 

## 2019-11-17 NOTE — ED Notes (Signed)
Pt ambulated in hallway with assistance. Pt noted to have an unsteady gait and required two people to assist him. Pt complaining of left sided pain when taking breaths. Dr. Bebe Shaggy notified.
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
## 2019-11-17 NOTE — ED Notes (Signed)
Pt transported to CT and XRAY 
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
## 2019-11-18 ENCOUNTER — Observation Stay (HOSPITAL_COMMUNITY): Payer: Self-pay

## 2019-11-18 LAB — BASIC METABOLIC PANEL
Anion gap: 4 — ABNORMAL LOW (ref 5–15)
BUN: 18 mg/dL (ref 6–20)
CO2: 28 mmol/L (ref 22–32)
Calcium: 8.9 mg/dL (ref 8.9–10.3)
Chloride: 108 mmol/L (ref 98–111)
Creatinine, Ser: 1.25 mg/dL — ABNORMAL HIGH (ref 0.61–1.24)
GFR calc Af Amer: >60 mL/min (ref >60)
GFR calc non Af Amer: >60 mL/min (ref >60)
Glucose, Bld: 108 mg/dL — ABNORMAL HIGH (ref 70–99)
Potassium: 4.1 mmol/L (ref 3.5–5.1)
Sodium: 140 mmol/L (ref 135–145)

## 2019-11-18 LAB — CBC
HCT: 45 % (ref 39.0–52.0)
Hemoglobin: 14.7 g/dL (ref 13.0–17.0)
MCH: 30.5 pg (ref 26.0–34.0)
MCHC: 32.7 g/dL (ref 30.0–36.0)
MCV: 93.4 fL (ref 80.0–100.0)
Platelets: 121 10*3/uL — ABNORMAL LOW (ref 150–400)
RBC: 4.82 MIL/uL (ref 4.22–5.81)
RDW: 14.6 % (ref 11.5–15.5)
WBC: 6.1 10*3/uL (ref 4.0–10.5)
nRBC: 0 % (ref 0.0–0.2)

## 2019-11-18 MED ORDER — OXYCODONE HCL 5 MG PO TABS: 5.0000 mg | ORAL_TABLET | Freq: Four times a day (QID) | ORAL | 0 refills | Status: DC | PRN

## 2019-11-18 MED ORDER — ACETAMINOPHEN 500 MG PO TABS
1000.0000 mg | ORAL_TABLET | Freq: Four times a day (QID) | ORAL | Status: DC
  Administered 2019-11-18: 1000 mg via ORAL
  Filled 2019-11-18: qty 2

## 2019-11-18 MED ORDER — ACETAMINOPHEN 325 MG PO TABS: 650.0000 mg | ORAL_TABLET | Freq: Four times a day (QID) | ORAL | 0 refills | Status: DC | PRN

## 2019-11-18 MED ORDER — TRAMADOL HCL 50 MG PO TABS: 50.0000 mg | ORAL_TABLET | Freq: Four times a day (QID) | ORAL | 0 refills | Status: DC | PRN

## 2019-11-18 MED ORDER — TRAMADOL HCL 50 MG PO TABS
50.0000 mg | ORAL_TABLET | Freq: Four times a day (QID) | ORAL | Status: DC
  Administered 2019-11-18: 50 mg via ORAL
  Filled 2019-11-18: qty 1

## 2019-11-18 MED ORDER — METHOCARBAMOL 500 MG PO TABS
1000.0000 mg | ORAL_TABLET | Freq: Three times a day (TID) | ORAL | Status: DC
  Administered 2019-11-18: 1000 mg via ORAL

## 2019-11-18 MED ORDER — THIAMINE HCL 100 MG PO TABS: 100.0000 mg | ORAL_TABLET | Freq: Every day | ORAL | Status: DC

## 2019-11-18 MED ORDER — ADULT MULTIVITAMIN W/MINERALS CH: 1.0000 | ORAL_TABLET | Freq: Every day | ORAL | Status: DC

## 2019-11-18 MED ORDER — FOLIC ACID 1 MG PO TABS: 1.0000 mg | ORAL_TABLET | Freq: Every day | ORAL | Status: DC

## 2019-11-18 MED ORDER — METHOCARBAMOL 500 MG PO TABS: 1000.0000 mg | ORAL_TABLET | Freq: Three times a day (TID) | ORAL | 0 refills | Status: DC | PRN

## 2019-11-18 MED FILL — ACETAMINOPHEN 325 MG TABS: 325 | 8 days supply | Qty: 60 | Fill #0

## 2019-11-18 MED FILL — traMADol HCL 50 MG TABS: 50 | 4 days supply | Qty: 15 | Fill #0

## 2019-11-18 MED FILL — oxyCODONE HCL 5 MG TABS: 5 | 4 days supply | Qty: 15 | Fill #0

## 2019-11-18 MED FILL — METHOCARBAMOL 500 MG TABS: 500 | 10 days supply | Qty: 60 | Fill #0
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
## 2019-11-18 NOTE — Plan of Care (Signed)

## 2019-11-18 NOTE — Progress Notes (Signed)
Patient independently ambulated around unit x2.

## 2019-11-18 NOTE — TOC CAGE-AID Note (Signed)
Transition of Care [facility]) - CAGE-AID Screening   Patient Details  Name: KENRIC GINGER MRN: [id number] Date of Birth: 1961/06/23  [facility] CM/SW Contact:    Emeterio Reeve, [place] Phone Number: 11/18/2019, 12:45 PM   Clinical Narrative:  CSW met with pt at bedside. CSW introduced self and explained her role at the hospital. CSW completed Cage aid assessment with pt. Pt states he drinks alcohol multiple times a week. Pt states he uses marijuana occasionally. Pt stated that he does not have a problem with alcohol or substance use. CSW offered education around alcohol/ substance use. Pt stated he has not interested in stopping either.     CAGE-AID Screening:    Have You Ever Felt You Ought to Cut Down on Your Drinking or Drug Use?: No Have People Annoyed You By Critizing Your Drinking Or Drug Use?: Yes Have You Felt Bad Or Guilty About Your Drinking Or Drug Use?: No Have You Ever Had a Drink or Used Drugs First Thing In The Morning to STeady Your Nerves or to Get Rid of a Hangover?: No CAGE-AID Score: 1  Substance Abuse Education Offered: Yes     Blima Ledger, [place] Social Worker [phone]
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
## 2019-11-18 NOTE — Progress Notes (Signed)
PT Cancellation Note  Patient Details Name: [name] [name] MRN: [id number] DOB: [birth year]   Cancelled Treatment:    Reason Eval/Treat Not Completed: (P) Patient declined, no reason specified(Pt ready to d/c home, denied need for PT services this pm before d/c.  Per RN ambulated 2 laps around the unit today independently.)   Jeffren Dombek J Aundria Rud 11/18/2019, 3:37 PM Bonney Leitz , PTA Acute Rehabilitation Services Pager [phone] Office [phone]
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
## 2020-09-09 ENCOUNTER — Encounter (HOSPITAL_COMMUNITY): Payer: Self-pay | Admitting: Registered Nurse

## 2020-09-09 ENCOUNTER — Ambulatory Visit (HOSPITAL_COMMUNITY)
Admission: EM | Admit: 2020-09-09 | Discharge: 2020-09-09 | Disposition: A | Discharge status: Home / Self Care | Payer: No Payment, Other | Attending: Registered Nurse | Admitting: Registered Nurse

## 2020-09-09 ENCOUNTER — Other Ambulatory Visit: Payer: Self-pay

## 2020-09-09 DIAGNOSIS — Z046 Encounter for general psychiatric examination, requested by authority: Secondary | ICD-10-CM

## 2020-09-09 DIAGNOSIS — F1290 Cannabis use, unspecified, uncomplicated: Secondary | ICD-10-CM | POA: Insufficient documentation

## 2020-09-09 DIAGNOSIS — Z638 Other specified problems related to primary support group: Secondary | ICD-10-CM

## 2020-09-09 DIAGNOSIS — R456 Violent behavior: Secondary | ICD-10-CM | POA: Insufficient documentation

## 2020-09-09 DIAGNOSIS — R4689 Other symptoms and signs involving appearance and behavior: Secondary | ICD-10-CM | POA: Diagnosis present
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
## 2021-04-04 ENCOUNTER — Encounter (HOSPITAL_BASED_OUTPATIENT_CLINIC_OR_DEPARTMENT_OTHER): Payer: Self-pay | Admitting: Emergency Medicine

## 2021-04-04 ENCOUNTER — Other Ambulatory Visit: Payer: Self-pay

## 2021-04-04 ENCOUNTER — Emergency Department (HOSPITAL_BASED_OUTPATIENT_CLINIC_OR_DEPARTMENT_OTHER)
Admission: EM | Admit: 2021-04-04 | Discharge: 2021-04-04 | Disposition: A | Discharge status: Home / Self Care | Payer: Self-pay | Attending: Emergency Medicine | Admitting: Emergency Medicine

## 2021-04-04 DIAGNOSIS — W57XXXA Bitten or stung by nonvenomous insect and other nonvenomous arthropods, initial encounter: Secondary | ICD-10-CM | POA: Insufficient documentation

## 2021-04-04 DIAGNOSIS — L03114 Cellulitis of left upper limb: Secondary | ICD-10-CM | POA: Insufficient documentation

## 2021-04-04 DIAGNOSIS — F17210 Nicotine dependence, cigarettes, uncomplicated: Secondary | ICD-10-CM | POA: Insufficient documentation

## 2021-04-04 MED ORDER — DOXYCYCLINE HYCLATE 100 MG PO CAPS
100.0000 mg | ORAL_CAPSULE | Freq: Two times a day (BID) | ORAL | 0 refills | Status: DC
Start: 2021-04-04 — End: 2021-07-08

## 2021-04-04 MED ORDER — IBUPROFEN 800 MG PO TABS
800.0000 mg | ORAL_TABLET | Freq: Three times a day (TID) | ORAL | 0 refills | Status: DC | PRN
Start: 2021-04-04 — End: 2021-07-08
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
## 2021-04-04 NOTE — Discharge Instructions (Addendum)
You are seen in the emergency department for redness swelling and tenderness of your left arm.  This appears to be a cellulitis or skin infection.  We are starting you on some antibiotics and some ibuprofen for pain.  Please finish your medication and return to the emergency department if redness is extending or you are getting high fevers.

## 2021-04-04 NOTE — ED Triage Notes (Signed)
Pt thinks he was bit by an insect; bump to LT forearm x 3d, now with extensive swelling to LUE

## 2021-07-08 ENCOUNTER — Emergency Department (HOSPITAL_BASED_OUTPATIENT_CLINIC_OR_DEPARTMENT_OTHER)
Admission: EM | Admit: 2021-07-08 | Discharge: 2021-07-09 | Disposition: A | Discharge status: Home / Self Care | Payer: Self-pay | Attending: Emergency Medicine | Admitting: Emergency Medicine

## 2021-07-08 ENCOUNTER — Emergency Department (HOSPITAL_BASED_OUTPATIENT_CLINIC_OR_DEPARTMENT_OTHER): Payer: Self-pay

## 2021-07-08 ENCOUNTER — Encounter (HOSPITAL_BASED_OUTPATIENT_CLINIC_OR_DEPARTMENT_OTHER): Payer: Self-pay | Admitting: *Deleted

## 2021-07-08 ENCOUNTER — Other Ambulatory Visit: Payer: Self-pay

## 2021-07-08 DIAGNOSIS — S2241XA Multiple fractures of ribs, right side, initial encounter for closed fracture: Secondary | ICD-10-CM | POA: Insufficient documentation

## 2021-07-08 DIAGNOSIS — W132XXA Fall from, out of or through roof, initial encounter: Secondary | ICD-10-CM | POA: Insufficient documentation

## 2021-07-08 DIAGNOSIS — R030 Elevated blood-pressure reading, without diagnosis of hypertension: Secondary | ICD-10-CM | POA: Insufficient documentation

## 2021-07-08 MED ORDER — HYDROCODONE-ACETAMINOPHEN 5-325 MG PO TABS
1.0000 | ORAL_TABLET | Freq: Once | ORAL | Status: AC
  Administered 2021-07-08: 1 via ORAL
  Filled 2021-07-08: qty 1

## 2021-07-08 MED ORDER — HYDROCODONE-ACETAMINOPHEN 5-325 MG PO TABS: 1.0000 | ORAL_TABLET | ORAL | 0 refills | Status: DC | PRN

## 2021-07-08 NOTE — ED Notes (Signed)
Patient transported to X-ray 

## 2021-07-08 NOTE — Discharge Instructions (Signed)
Apply ice for 30 minutes at a time, 4 times a day.  Several times a day, take some deep breaths, even if it hurts.  This will help keep you from developing pneumonia.  You may take acetaminophen for less severe pain.  Your blood pressure was slightly elevated today.  That may be because of pain.  Please have your blood pressure checked several times over the next 1-2 weeks.  If it continues to stay high, you may need to be on medication to control it.  High blood pressure which is not adequately controlled can lead to heart attacks, strokes, kidney failure.

## 2021-07-08 NOTE — ED Provider Notes (Signed)
[facility] EMERGENCY DEPARTMENT Provider Note   CSN: [id number] Arrival date & time: 07/08/21  2247     History  Chief Complaint  Patient presents with   [name] [name] is a 61 y.o. male.  The history is provided by the patient.  Fall He was working on a roof 2 days ago and fell through and struck his right mid back on some rafters.  He is complaining of severe pain in that area which he rates a 10/10.  Pain is worse with movement and worse with breathing.  He denies any shortness of breath.  He has been taking acetaminophen for pain without relief.  He states he cannot take ibuprofen because of stomach upset.  He denies other injury.   Home Medications Prior to Admission medications   Medication Sig Start Date End Date Taking? Authorizing Provider  HYDROcodone-acetaminophen (NORCO) 5-325 MG tablet Take 1 tablet by mouth every 4 (four) hours as needed for moderate pain. 07/08/21  Yes Dione Booze, MD  acetaminophen (TYLENOL) 325 MG tablet Take 2 tablets (650 mg total) by mouth every 6 (six) hours as needed. 11/18/19   Maczis, Elmer Sow, PA-C  folic acid (FOLVITE) 1 MG tablet Take 1 tablet (1 mg total) by mouth daily. 11/19/19   Maczis, Elmer Sow, PA-C  methocarbamol (ROBAXIN) 500 MG tablet Take 2 tablets (1,000 mg total) by mouth every 8 (eight) hours as needed for muscle spasms. 11/18/19   Maczis, Elmer Sow, PA-C  Multiple Vitamin (MULTIVITAMIN WITH MINERALS) TABS tablet Take 1 tablet by mouth daily. 11/19/19   Maczis, Elmer Sow, PA-C  thiamine 100 MG tablet Take 1 tablet (100 mg total) by mouth daily. 11/19/19   Maczis, Elmer Sow, PA-C      Allergies    Phenobarbital    Review of Systems   Review of Systems  All other systems reviewed and are negative.  Physical Exam Updated Vital Signs BP (!) 139/99 (BP Location: Right Arm)    Pulse 95    Temp 98.2 F (36.8 C) (Oral)    Resp 18    Ht 5\' 10"  (1.778 m)    Wt 72.6 kg    SpO2 99%    BMI 22.97 kg/m  Physical  Exam Vitals and nursing note reviewed.  60 year old male, resting comfortably and in no acute distress. Vital signs are significant for elevated blood pressure. Oxygen saturation is 99%, which is normal. Head is normocephalic and atraumatic. PERRLA, EOMI. Oropharynx is clear. Neck is nontender and supple without adenopathy or JVD. Back is nontender and there is no CVA tenderness. Lungs are clear without rales, wheezes, or rhonchi. Chest is markedly tender in the right posterior lower rib cage.  There is no crepitus. Heart has regular rate and rhythm without murmur. Abdomen is soft, flat, nontender. Extremities have no cyanosis or edema, full range of motion is present. Skin is warm and dry without rash. Neurologic: Mental status is normal, cranial nerves are intact, moves all extremities equally.  ED Results / Procedures / Treatments    Radiology DG Ribs Unilateral W/Chest Right  Result Date: 07/08/2021 CLINICAL DATA:  Fall right rib injury EXAM: RIGHT RIBS AND CHEST - 3+ VIEW COMPARISON:  11/18/2019 FINDINGS: There are acute mildly displaced fractures of the right eleventh and twelfth ribs posterolaterally. Bronchitic changes are noted within the perihilar regions bilaterally. No confluent pulmonary infiltrate. No pneumothorax or pleural effusion. Cardiac size within normal limits. IMPRESSION: Mildly displaced acute fracture  of the right eleventh and twelfth ribs. No pneumothorax. Bronchitic change.  No confluent pulmonary infiltrate. Electronically Signed   By: Helyn Numbers M.D.   On: 07/08/2021 23:26    Procedures Procedures    Medications Ordered in ED Medications  HYDROcodone-acetaminophen (NORCO/VICODIN) 5-325 MG per tablet 1 tablet (1 tablet Oral Given 07/08/21 2352)    ED Course/ Medical Decision Making/ A&P                           Medical Decision Making  Fall with rib cage injury.  X-rays are ordered and show fractures of the right 11th and 12th ribs.  I have  independently viewed the x-rays and agree with radiologist interpretation.  Because it has been 2 days since the injury and he has heart rate, normal oxygen saturation, no hypotension, there is no need for advanced imaging.  Old records are reviewed, and he has no relevant past visits.  He is discharged with prescription for hydrocodone-acetaminophen and referred to sports medicine for follow-up.  Recommended blood pressure recheck as an outpatient.        Final Clinical Impression(s) / ED Diagnoses Final diagnoses:  Closed fracture of two ribs of right side, initial encounter  Elevated blood pressure reading without diagnosis of hypertension    Rx / DC Orders ED Discharge Orders          Ordered    HYDROcodone-acetaminophen (NORCO) 5-325 MG tablet  Every 4 hours PRN        07/08/21 2350              Dione Booze, MD 07/08/21 2356

## 2021-07-08 NOTE — ED Triage Notes (Signed)
He fell off a roof 2 days ago. Injury to his right ribs. Painful to move or take a breath. Bruising noted to his flank.

## 2022-06-05 ENCOUNTER — Encounter (HOSPITAL_BASED_OUTPATIENT_CLINIC_OR_DEPARTMENT_OTHER): Payer: Self-pay | Admitting: Emergency Medicine

## 2022-06-05 ENCOUNTER — Other Ambulatory Visit: Payer: Self-pay

## 2022-06-05 ENCOUNTER — Emergency Department (HOSPITAL_BASED_OUTPATIENT_CLINIC_OR_DEPARTMENT_OTHER): Payer: Self-pay

## 2022-06-05 DIAGNOSIS — M79672 Pain in left foot: Secondary | ICD-10-CM | POA: Insufficient documentation

## 2022-06-05 NOTE — ED Triage Notes (Signed)
Patient reports he fell off a porch Wednesday night. C/o left foot pain. Redness and swelling noted.

## 2022-06-06 ENCOUNTER — Emergency Department (HOSPITAL_BASED_OUTPATIENT_CLINIC_OR_DEPARTMENT_OTHER)
Admission: EM | Admit: 2022-06-06 | Discharge: 2022-06-06 | Disposition: A | Discharge status: Home / Self Care | Payer: Self-pay | Attending: Emergency Medicine | Admitting: Emergency Medicine

## 2022-06-06 ENCOUNTER — Emergency Department (HOSPITAL_COMMUNITY): Payer: Self-pay

## 2022-06-06 DIAGNOSIS — M79672 Pain in left foot: Secondary | ICD-10-CM

## 2022-06-06 LAB — BASIC METABOLIC PANEL
Anion gap: 6 (ref 5–15)
BUN: 16 mg/dL (ref 8–23)
CO2: 29 mmol/L (ref 22–32)
Calcium: 9.1 mg/dL (ref 8.9–10.3)
Chloride: 104 mmol/L (ref 98–111)
Creatinine, Ser: 0.96 mg/dL (ref 0.61–1.24)
GFR, Estimated: >60 mL/min (ref >60)
Glucose, Bld: 111 mg/dL — ABNORMAL HIGH (ref 70–99)
Potassium: 4.2 mmol/L (ref 3.5–5.1)
Sodium: 139 mmol/L (ref 135–145)

## 2022-06-06 LAB — CBC WITH DIFFERENTIAL/PLATELET
Abs Immature Granulocytes: 0.01 10*3/uL (ref 0.00–0.07)
Basophils Absolute: 0 10*3/uL (ref 0.0–0.1)
Basophils Relative: 0 %
Eosinophils Absolute: 0.1 10*3/uL (ref 0.0–0.5)
Eosinophils Relative: 2 %
HCT: 43.7 % (ref 39.0–52.0)
Hemoglobin: 14.5 g/dL (ref 13.0–17.0)
Immature Granulocytes: 0 %
Lymphocytes Relative: 25 %
Lymphs Abs: 1.7 10*3/uL (ref 0.7–4.0)
MCH: 29.6 pg (ref 26.0–34.0)
MCHC: 33.2 g/dL (ref 30.0–36.0)
MCV: 89.2 fL (ref 80.0–100.0)
Monocytes Absolute: 0.4 10*3/uL (ref 0.1–1.0)
Monocytes Relative: 6 %
Neutro Abs: 4.4 10*3/uL (ref 1.7–7.7)
Neutrophils Relative %: 67 %
Platelets: 179 10*3/uL (ref 150–400)
RBC: 4.9 MIL/uL (ref 4.22–5.81)
RDW: 14.2 % (ref 11.5–15.5)
WBC: 6.6 10*3/uL (ref 4.0–10.5)
nRBC: 0 % (ref 0.0–0.2)

## 2022-06-06 MED ORDER — OXYCODONE HCL 5 MG PO TABS: 5.0000 mg | ORAL_TABLET | Freq: Four times a day (QID) | ORAL | 0 refills | Status: DC | PRN

## 2022-06-06 MED ORDER — HYDROMORPHONE HCL 1 MG/ML IJ SOLN
1.0000 mg | Freq: Once | INTRAMUSCULAR | Status: AC
  Administered 2022-06-06: 1 mg via INTRAVENOUS
  Filled 2022-06-06: qty 1

## 2022-06-06 MED ORDER — OXYCODONE HCL 5 MG PO TABS
5.0000 mg | ORAL_TABLET | Freq: Once | ORAL | Status: AC
  Administered 2022-06-06: 5 mg via ORAL
  Filled 2022-06-06: qty 1

## 2022-06-06 MED ORDER — PREDNISONE 10 MG PO TABS: 40.0000 mg | ORAL_TABLET | Freq: Every day | ORAL | 0 refills | Status: AC

## 2022-06-06 MED ORDER — PREDNISONE 10 MG PO TABS: 20.0000 mg | ORAL_TABLET | Freq: Every day | ORAL | 0 refills | Status: DC

## 2022-06-06 NOTE — ED Notes (Signed)
Pt back from CT

## 2022-06-06 NOTE — Progress Notes (Signed)
Orthopedic Tech Progress Note Patient Details:  [name] [name] 1961/05/20 [id number]  Ortho Devices Type of Ortho Device: Crutches, CAM walker Ortho Device/Splint Location: LLE Ortho Device/Splint Interventions: Ordered, Application, Adjustment   Post Interventions Patient Tolerated: Well, Ambulated well Instructions Provided: Poper ambulation with device, Care of device  [name] 06/06/2022, 10:55 AM

## 2022-06-06 NOTE — ED Notes (Signed)
Called ortho for crutches and CAM boot

## 2022-06-06 NOTE — ED Notes (Signed)
Called patient no answer.

## 2022-06-06 NOTE — ED Notes (Signed)
Spoke with his sister Rebeca Alert and she will transport pt to [place] for r/o compartment syndrome.  Charge RN is aware as well as Engineer, materials
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
## 2022-06-06 NOTE — ED Provider Notes (Signed)
Orthopedics, Earney Hamburg evaluated the patient.  No concern for compartment syndrome.  CT scan was done to rule out occult fracture.  CT did not show any fracture.  Overall suspect inflammatory process.  Orthopedics recommending walking boot, crutches, steroids, pain control and will follow-up with orthopedics outpatient.  Discharged in good condition.  Understands return precautions.   Virgina Norfolk, DO 06/06/22 1140

## 2022-06-06 NOTE — ED Notes (Signed)
Reviewed discharge instructions with patient. Follow-up care and medications reviewed. Patient  verbalized understanding. Patient A&Ox4, VSS, and ambulatory with steady gait w/ crutches upon discharge.

## 2022-06-06 NOTE — Discharge Instructions (Addendum)
Overall suspect that your symptoms are from inflammation.  Follow-up with orthopedics.  Wear walking boot for comfort.  Recommend Tylenol and ibuprofen for pain.  I have written you for narcotic, Roxicodone for breakthrough pain.  Take steroids as prescribed.
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
## 2022-06-06 NOTE — ED Notes (Signed)
Message sent to [facility] RN regarding pt transfering POV to [facility] ED

## 2022-06-06 NOTE — ED Provider Notes (Signed)
61 year old male transferred from med [facility] for concern of compartment syndrome of his left foot after a fall without a fracture couple days ago.  Pain is better controlled now.  Strong pulse.  Difficulty moving digit secondary to pain.  Discussed with orthopedics.  They will consult in a.m.  No further imaging or lab recommendations at this time.   Josip Merolla, Barbara Cower, MD 06/06/22 [phone]

## 2022-07-23 ENCOUNTER — Other Ambulatory Visit: Payer: Self-pay

## 2022-07-23 ENCOUNTER — Inpatient Hospital Stay (HOSPITAL_BASED_OUTPATIENT_CLINIC_OR_DEPARTMENT_OTHER)
Admission: EM | Admit: 2022-07-23 | Discharge: 2022-07-26 | DRG: 549 | Disposition: A | Discharge status: Home / Self Care | Payer: Medicaid Other | Attending: Internal Medicine | Admitting: Internal Medicine

## 2022-07-23 ENCOUNTER — Emergency Department (HOSPITAL_BASED_OUTPATIENT_CLINIC_OR_DEPARTMENT_OTHER): Payer: Medicaid Other | Admitting: Radiology

## 2022-07-23 ENCOUNTER — Encounter (HOSPITAL_BASED_OUTPATIENT_CLINIC_OR_DEPARTMENT_OTHER): Payer: Self-pay | Admitting: Emergency Medicine

## 2022-07-23 ENCOUNTER — Emergency Department (HOSPITAL_BASED_OUTPATIENT_CLINIC_OR_DEPARTMENT_OTHER): Payer: Medicaid Other

## 2022-07-23 ENCOUNTER — Observation Stay (HOSPITAL_COMMUNITY): Payer: Medicaid Other

## 2022-07-23 ENCOUNTER — Encounter (HOSPITAL_COMMUNITY): Payer: Self-pay

## 2022-07-23 DIAGNOSIS — M109 Gout, unspecified: Secondary | ICD-10-CM | POA: Diagnosis present

## 2022-07-23 DIAGNOSIS — Z720 Tobacco use: Secondary | ICD-10-CM | POA: Diagnosis not present

## 2022-07-23 DIAGNOSIS — R052 Subacute cough: Secondary | ICD-10-CM | POA: Diagnosis not present

## 2022-07-23 DIAGNOSIS — M1A9XX0 Chronic gout, unspecified, without tophus (tophi): Secondary | ICD-10-CM

## 2022-07-23 DIAGNOSIS — M1A9XX1 Chronic gout, unspecified, with tophus (tophi): Secondary | ICD-10-CM | POA: Diagnosis present

## 2022-07-23 DIAGNOSIS — F17210 Nicotine dependence, cigarettes, uncomplicated: Secondary | ICD-10-CM | POA: Diagnosis present

## 2022-07-23 DIAGNOSIS — M25561 Pain in right knee: Secondary | ICD-10-CM | POA: Diagnosis present

## 2022-07-23 DIAGNOSIS — Z885 Allergy status to narcotic agent status: Secondary | ICD-10-CM | POA: Diagnosis not present

## 2022-07-23 DIAGNOSIS — Z6821 Body mass index (BMI) 21.0-21.9, adult: Secondary | ICD-10-CM

## 2022-07-23 DIAGNOSIS — E440 Moderate protein-calorie malnutrition: Secondary | ICD-10-CM | POA: Diagnosis not present

## 2022-07-23 DIAGNOSIS — M009 Pyogenic arthritis, unspecified: Principal | ICD-10-CM | POA: Diagnosis present

## 2022-07-23 DIAGNOSIS — F1010 Alcohol abuse, uncomplicated: Secondary | ICD-10-CM | POA: Diagnosis present

## 2022-07-23 DIAGNOSIS — Z79899 Other long term (current) drug therapy: Secondary | ICD-10-CM | POA: Diagnosis not present

## 2022-07-23 DIAGNOSIS — M1009 Idiopathic gout, multiple sites: Secondary | ICD-10-CM | POA: Diagnosis present

## 2022-07-23 DIAGNOSIS — M1A0721 Idiopathic chronic gout, left ankle and foot, with tophus (tophi): Secondary | ICD-10-CM | POA: Diagnosis not present

## 2022-07-23 DIAGNOSIS — M25461 Effusion, right knee: Secondary | ICD-10-CM | POA: Diagnosis present

## 2022-07-23 DIAGNOSIS — R059 Cough, unspecified: Secondary | ICD-10-CM | POA: Diagnosis present

## 2022-07-23 LAB — SYNOVIAL CELL COUNT + DIFF, W/ CRYSTALS: WBC, Synovial: UNDETERMINED /mm3 (ref 0–200)

## 2022-07-23 LAB — C-REACTIVE PROTEIN: CRP: 15.1 mg/dL — ABNORMAL HIGH (ref <1.0)

## 2022-07-23 LAB — CBC WITH DIFFERENTIAL/PLATELET
Abs Immature Granulocytes: 0.03 10*3/uL (ref 0.00–0.07)
Basophils Absolute: 0 10*3/uL (ref 0.0–0.1)
Basophils Relative: 0 %
Eosinophils Absolute: 0.1 10*3/uL (ref 0.0–0.5)
Eosinophils Relative: 1 %
HCT: 37.2 % — ABNORMAL LOW (ref 39.0–52.0)
Hemoglobin: 12.2 g/dL — ABNORMAL LOW (ref 13.0–17.0)
Immature Granulocytes: 0 %
Lymphocytes Relative: 19 %
Lymphs Abs: 1.7 10*3/uL (ref 0.7–4.0)
MCH: 28.2 pg (ref 26.0–34.0)
MCHC: 32.8 g/dL (ref 30.0–36.0)
MCV: 86.1 fL (ref 80.0–100.0)
Monocytes Absolute: 0.7 10*3/uL (ref 0.1–1.0)
Monocytes Relative: 7 %
Neutro Abs: 6.6 10*3/uL (ref 1.7–7.7)
Neutrophils Relative %: 73 %
Platelets: 252 10*3/uL (ref 150–400)
RBC: 4.32 MIL/uL (ref 4.22–5.81)
RDW: 13.7 % (ref 11.5–15.5)
WBC: 9.1 10*3/uL (ref 4.0–10.5)
nRBC: 0 % (ref 0.0–0.2)

## 2022-07-23 LAB — LACTIC ACID, PLASMA: Lactic Acid, Venous: 1.1 mmol/L (ref 0.5–1.9)

## 2022-07-23 LAB — BASIC METABOLIC PANEL
Anion gap: 7 (ref 5–15)
BUN: 13 mg/dL (ref 8–23)
CO2: 27 mmol/L (ref 22–32)
Calcium: 9.1 mg/dL (ref 8.9–10.3)
Chloride: 102 mmol/L (ref 98–111)
Creatinine, Ser: 0.75 mg/dL (ref 0.61–1.24)
GFR, Estimated: >60 mL/min (ref >60)
Glucose, Bld: 123 mg/dL — ABNORMAL HIGH (ref 70–99)
Potassium: 4 mmol/L (ref 3.5–5.1)
Sodium: 136 mmol/L (ref 135–145)

## 2022-07-23 LAB — ETHANOL: Alcohol, Ethyl (B): <10 mg/dL (ref <10)

## 2022-07-23 LAB — PROCALCITONIN: Procalcitonin: <0.1 ng/mL

## 2022-07-23 LAB — SEDIMENTATION RATE: Sed Rate: 59 mm/hr — ABNORMAL HIGH (ref 0–16)

## 2022-07-23 LAB — HIV ANTIBODY (ROUTINE TESTING W REFLEX): HIV Screen 4th Generation wRfx: NONREACTIVE

## 2022-07-23 LAB — MAGNESIUM: Magnesium: 2 mg/dL (ref 1.7–2.4)

## 2022-07-23 LAB — URIC ACID: Uric Acid, Serum: 5.2 mg/dL (ref 3.7–8.6)

## 2022-07-23 LAB — PHOSPHORUS: Phosphorus: 3.9 mg/dL (ref 2.5–4.6)

## 2022-07-23 MED ORDER — ACETAMINOPHEN 650 MG RE SUPP: 650.0000 mg | Freq: Four times a day (QID) | RECTAL | Status: DC | PRN

## 2022-07-23 MED ORDER — SODIUM CHLORIDE 0.9 % IV SOLN
2.0000 g | INTRAVENOUS | Status: DC
  Administered 2022-07-23 – 2022-07-25 (×3): 2 g via INTRAVENOUS
  Filled 2022-07-23 (×3): qty 20

## 2022-07-23 MED ORDER — OXYCODONE-ACETAMINOPHEN 5-325 MG PO TABS
1.0000 | ORAL_TABLET | Freq: Once | ORAL | Status: AC
  Administered 2022-07-23: 1 via ORAL
  Filled 2022-07-23: qty 1

## 2022-07-23 MED ORDER — VANCOMYCIN HCL IN DEXTROSE 1-5 GM/200ML-% IV SOLN
1000.0000 mg | Freq: Once | INTRAVENOUS | Status: AC
  Administered 2022-07-23: 1000 mg via INTRAVENOUS
  Filled 2022-07-23: qty 200

## 2022-07-23 MED ORDER — HYDROCODONE-ACETAMINOPHEN 5-325 MG PO TABS
1.0000 | ORAL_TABLET | ORAL | Status: DC | PRN
  Administered 2022-07-23 – 2022-07-24 (×2): 2 via ORAL
  Filled 2022-07-23 (×2): qty 2

## 2022-07-23 MED ORDER — MUPIROCIN 2 % EX OINT
1.0000 | TOPICAL_OINTMENT | Freq: Two times a day (BID) | CUTANEOUS | Status: DC
  Administered 2022-07-24 – 2022-07-26 (×5): 1 via NASAL
  Filled 2022-07-23: qty 22

## 2022-07-23 MED ORDER — SODIUM CHLORIDE 0.9 % IV SOLN
1.0000 g | Freq: Once | INTRAVENOUS | Status: AC
  Administered 2022-07-23: 1 g via INTRAVENOUS
  Filled 2022-07-23: qty 10

## 2022-07-23 MED ORDER — GUAIFENESIN ER 600 MG PO TB12
600.0000 mg | ORAL_TABLET | Freq: Two times a day (BID) | ORAL | Status: DC
  Administered 2022-07-23 – 2022-07-24 (×3): 600 mg via ORAL
  Filled 2022-07-23 (×3): qty 1

## 2022-07-23 MED ORDER — MORPHINE SULFATE (PF) 4 MG/ML IV SOLN
6.0000 mg | Freq: Once | INTRAVENOUS | Status: AC
  Administered 2022-07-23: 6 mg via INTRAMUSCULAR
  Filled 2022-07-23: qty 2

## 2022-07-23 MED ORDER — IPRATROPIUM-ALBUTEROL 0.5-2.5 (3) MG/3ML IN SOLN
3.0000 mL | Freq: Four times a day (QID) | RESPIRATORY_TRACT | Status: DC
  Administered 2022-07-24: 3 mL via RESPIRATORY_TRACT
  Filled 2022-07-23: qty 3

## 2022-07-23 MED ORDER — SODIUM CHLORIDE 0.9 % IV SOLN: INTRAVENOUS | Status: DC

## 2022-07-23 MED ORDER — VANCOMYCIN HCL IN DEXTROSE 1-5 GM/200ML-% IV SOLN
1000.0000 mg | Freq: Two times a day (BID) | INTRAVENOUS | Status: DC
  Administered 2022-07-23 – 2022-07-24 (×3): 1000 mg via INTRAVENOUS
  Filled 2022-07-23 (×4): qty 200

## 2022-07-23 MED ORDER — SODIUM CHLORIDE 0.9 % IV SOLN: 2.0000 g | Freq: Three times a day (TID) | INTRAVENOUS | Status: DC

## 2022-07-23 MED ORDER — MORPHINE SULFATE (PF) 2 MG/ML IV SOLN: 2.0000 mg | INTRAVENOUS | Status: DC | PRN

## 2022-07-23 MED ORDER — ALBUTEROL SULFATE (2.5 MG/3ML) 0.083% IN NEBU
2.5000 mg | INHALATION_SOLUTION | RESPIRATORY_TRACT | Status: DC | PRN
  Administered 2022-07-24: 2.5 mg via RESPIRATORY_TRACT
  Filled 2022-07-23: qty 3

## 2022-07-23 MED ORDER — NICOTINE 21 MG/24HR TD PT24
21.0000 mg | MEDICATED_PATCH | Freq: Every day | TRANSDERMAL | Status: DC
  Administered 2022-07-23: 21 mg via TRANSDERMAL
  Filled 2022-07-23 (×3): qty 1

## 2022-07-23 MED ORDER — LIDOCAINE HCL (PF) 1 % IJ SOLN
5.0000 mL | Freq: Once | INTRAMUSCULAR | Status: AC
  Administered 2022-07-23: 5 mL
  Filled 2022-07-23: qty 5

## 2022-07-23 MED ORDER — OXYCODONE-ACETAMINOPHEN 5-325 MG PO TABS
1.0000 | ORAL_TABLET | Freq: Four times a day (QID) | ORAL | Status: DC | PRN
  Administered 2022-07-24: 1 via ORAL
  Filled 2022-07-23: qty 1

## 2022-07-23 MED ORDER — ACETAMINOPHEN 325 MG PO TABS: 650.0000 mg | ORAL_TABLET | Freq: Four times a day (QID) | ORAL | Status: DC | PRN

## 2022-07-23 NOTE — Progress Notes (Signed)
Plan of Care Note for accepted transfer   Patient: [name] [name] MRN: [id number]   [place]: 07/23/2022  Facility requesting transfer: [facility] Requesting Provider: Dr. Tamera Punt Reason for transfer: Septic knee joint Facility course: 62 yo M w/ PMHx of gout, chronic pain. Presenting with right knee pain. 4 days of swelling and erythema of the right knee. EDP got frank puss from joint tap. He was started on rocephin/vanc. EDP spoke with Dr. Lillia Corporal. Rec'd medical admission to [facility] of care: The patient is accepted for admission to [facility]  unit, at [facility]. While holding at [facility], medical decision making responsibilities remain with the [facility]. Upon arrival to [facility], [facility] will assume care. Thank you.   Author: Jonnie Finner, DO 07/23/2022  Check [email or web address] for on-call coverage.  Nursing staff, Please call [place] number on Amion as soon as patient's arrival, so appropriate admitting provider can evaluate the pt.

## 2022-07-23 NOTE — Progress Notes (Signed)
Paged [facility] and [facility] to inform that patient just arrived to the unit. Waiting on further orders.

## 2022-07-23 NOTE — ED Notes (Signed)
Kim at [facility] will send transport to [facility]ABB(NS) 

## 2022-07-23 NOTE — ED Notes (Signed)
Colletta Maryland RN to call back for report.

## 2022-07-23 NOTE — Assessment & Plan Note (Signed)
Patient has known history of gout Unclear if current episode of is gout versus more when infection.  Will check uric acid appreciate orthopedics insight

## 2022-07-23 NOTE — Subjective & Objective (Signed)
Presents with 4-day history right knee pain and swelling Has known history of gout but never had a gout in his knee. He undergone patella tendon repair in 2014 by Dr. Alvan Dame And states he has a wire in his knee he was supposed to follow-up with orthopedics but did not.  He was told that eventually the wire will break and he will need further surgical repair. Does not endorse any fevers

## 2022-07-23 NOTE — Plan of Care (Signed)
Plan of care discussed.   

## 2022-07-23 NOTE — ED Triage Notes (Signed)
Pt BIB [facility] from home c/o right knee pain, pt reports he has broken wire that was implanted from a surgery 2014, pt endorses hx of gout, swelling to right knee and left foot pain from gout, v/s stable en route

## 2022-07-23 NOTE — Assessment & Plan Note (Signed)
Orthopedics is aware Will see patient in consult Advise at this time broad-spectrum antibiotics Patient initially received Rocephin and vancomycin will change to cefepime vancomycin as per protocol. Start message to ID for consult.  Keep n.p.o. postmidnight in case needs washout

## 2022-07-23 NOTE — Assessment & Plan Note (Signed)
-  Spoke about importance of quitting spent 5 minutes discussing options for treatment, prior attempts at quitting, and dangers of smoking ? -At this point patient is    interested in quitting ? - order nicotine patch  ? - nursing tobacco cessation protocol ? ?
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
## 2022-07-23 NOTE — Progress Notes (Signed)
Pharmacy Antibiotic Note  [name] [name] is a 62 y.o. male admitted on 07/23/2022 with septic joint of right knee, cellulitis. Pharmacy has been consulted for Vancomycin dosing.  Plan: Vancomycin 1g IV q12h Vancomycin levels at steady state, as indicated Monitor renal function, cultures, clinical course  Height: 5\' 10"  (177.8 cm) Weight: 68 kg (150 lb) IBW/kg (Calculated) : 73  Temp (24hrs), Avg:98.5 F (36.9 C), Min:98.1 F (36.7 C), Max:98.8 F (37.1 C)  Recent Labs  Lab 07/23/22 0913 07/23/22 1956  WBC 9.1  --   CREATININE 0.75  --   LATICACIDVEN  --  1.1    Estimated Creatinine Clearance: 90.9 mL/min (by C-G formula based on SCr of 0.75 mg/dL).    Allergies  Allergen Reactions   Phenobarbital Other (See Comments)    Go wild.    Antimicrobials this admission: 1/20 Ceftriaxone >> 1/20 Vancomycin >>  Dose adjustments this admission: --  Microbiology results: 1/20 right knee fluid:   Thank you for allowing pharmacy to be a part of this patient's care.   Lindell Spar, PharmD, BCPS Clinical Pharmacist 07/23/2022 8:43 PM

## 2022-07-23 NOTE — ED Provider Notes (Signed)
[facility] Provider Note   CSN: [id number] Arrival date & time: 07/23/22  0300     History  Chief Complaint  Patient presents with   Knee Pain    [name] [name] is a 62 y.o. male.  Patient is a 62 year old who presents with pain in his right knee.  He does have a history of gout previously and his left foot but denies having gout in his knees before.  He said over the last 4 days he has had some increasing pain and swelling to the knee.  He has a previous wire in the knee from a patellar tendon rupture repair in 2014 by Dr. Alvan Dame.  He says he has not followed up with Dr. Alvan Dame since his surgery.  He did report that Dr. Alvan Dame had advised him eventually the wire will break and he will need to have repeat surgery.  He denies any recent trauma or injury to the knee.  No known fevers.       Home Medications Prior to Admission medications   Medication Sig Start Date End Date Taking? Authorizing Provider  acetaminophen (TYLENOL) 325 MG tablet Take 2 tablets (650 mg total) by mouth every 6 (six) hours as needed. 11/18/19   Maczis, Barth Kirks, PA-C  folic acid (FOLVITE) 1 MG tablet Take 1 tablet (1 mg total) by mouth daily. 11/19/19   Maczis, Barth Kirks, PA-C  HYDROcodone-acetaminophen (NORCO) 5-325 MG tablet Take 1 tablet by mouth every 4 (four) hours as needed for moderate pain. 12/05/99   Delora Fuel, MD  methocarbamol (ROBAXIN) 500 MG tablet Take 2 tablets (1,000 mg total) by mouth every 8 (eight) hours as needed for muscle spasms. 11/18/19   Maczis, Barth Kirks, PA-C  Multiple Vitamin (MULTIVITAMIN WITH MINERALS) TABS tablet Take 1 tablet by mouth daily. 11/19/19   Maczis, Barth Kirks, PA-C  oxyCODONE (ROXICODONE) 5 MG immediate release tablet Take 1 tablet (5 mg total) by mouth every 6 (six) hours as needed for up to 10 doses for breakthrough pain. 06/06/22   Curatolo, Adam, DO  thiamine 100 MG tablet Take 1 tablet (100 mg total) by mouth daily. 11/19/19    Maczis, Barth Kirks, PA-C      Allergies    Phenobarbital    Review of Systems   Review of Systems  Constitutional:  Negative for fever.  Gastrointestinal:  Negative for nausea and vomiting.  Musculoskeletal:  Positive for arthralgias and joint swelling. Negative for back pain and neck pain.  Skin:  Negative for wound.  Neurological:  Negative for weakness, numbness and headaches.    Physical Exam Updated Vital Signs BP 120/75   Pulse 70   Temp 98.8 F (37.1 C) (Oral)   Resp 18   Ht 5\' 10"  (1.778 m)   Wt 68 kg   SpO2 96%   BMI 21.52 kg/m  Physical Exam Constitutional:      Appearance: He is well-developed.  HENT:     Head: Normocephalic and atraumatic.  Neck:     Comments: No pain to neck or back Musculoskeletal:        General: Tenderness present.     Cervical back: Normal range of motion and neck supple.     Comments: Patient has some mild diffuse swelling to the knee with a small effusion palpated.  He has generalized tenderness to the knee.  There are some mild warmth and erythema to the knee joint.  He has a large callus to the  lateral aspect of the anterior knee which he says is chronic and unchanged.  No wounds are noted.  No discrete area is of induration or fluctuance are noted.  Neurological:     Mental Status: He is alert and oriented to person, place, and time.     ED Results / Procedures / Treatments   Labs (all labs ordered are listed, but only abnormal results are displayed) Labs Reviewed  BASIC METABOLIC PANEL - Abnormal; Notable for the following components:      Result Value   Glucose, Bld 123 (*)    All other components within normal limits  CBC WITH DIFFERENTIAL/PLATELET - Abnormal; Notable for the following components:   Hemoglobin 12.2 (*)    HCT 37.2 (*)    All other components within normal limits  BODY FLUID CULTURE W GRAM STAIN  SYNOVIAL CELL COUNT + DIFF, W/ CRYSTALS    EKG None  Radiology CT Knee Right Wo Contrast  Result  Date: 07/23/2022 CLINICAL DATA:  Right knee pain. Possible infection. The patient has a fixation wire in the knee for prior patellar tendon repair. EXAM: CT OF THE RIGHT KNEE WITHOUT CONTRAST TECHNIQUE: Multidetector CT imaging of the right knee was performed according to the standard protocol. Multiplanar CT image reconstructions were also generated. RADIATION DOSE REDUCTION: This exam was performed according to the departmental dose-optimization program which includes automated exposure control, adjustment of the mA and/or kV according to patient size and/or use of iterative reconstruction technique. COMPARISON:  Plain films right knee 07/23/2022 and 04/25/2013. FINDINGS: Bones/Joint/Cartilage As seen on the plain films today, there is a fracture fixation wire extending from the patellar tuberosity through the patellar tendon into the patella. Heterotopic ossification is present about the wire. No bony destructive change is identified. There is no fracture. Moderate joint effusion is seen. Synovium appears thickened. Ligaments Suboptimally assessed by CT. Muscles and Tendons No intramuscular fluid collection or mass. Soft tissues There is infiltration of subcutaneous fat about the anterior aspect of the knee and proximal tibia. No soft tissue gas is identified. IMPRESSION: Infiltration of subcutaneous fat about the anterior aspect of the knee is worrisome for cellulitis. Although no CT evidence of osteomyelitis is identified, the patient has a moderate joint effusion which could be septic or aseptic. Aspiration is recommended for further evaluation. Status post patellar tendon repair. As seen on plain films earlier today, the fixation wire is fractured and there is heterotopic ossification about the tendon. Electronically Signed   By: Drusilla Kanner M.D.   On: 07/23/2022 09:37   DG Knee Complete 4 Views Right  Result Date: 07/23/2022 CLINICAL DATA:  Swelling. Knee pain. Patient reports broken wire associated  with surgery from 2014. EXAM: RIGHT KNEE - COMPLETE 4+ VIEW COMPARISON:  04/25/2013 FINDINGS: There is a small suprapatellar joint effusion. Interval fragmentation cerclage wire previously anchoring the patella with the tibial tubercle. There is overlying skin thickening along the anterior aspect of the knee. No sign of acute fracture or dislocation. Patellofemoral joint space narrowing noted. Chondrocalcinosis identified. IMPRESSION: 1. Small suprapatellar joint effusion. No acute fracture or dislocation. 2. Interval fragmentation of cerclage wire previously anchoring the patella with the tibial tubercle. 3. Anterior skin thickening. 4. Chondrocalcinosis. 5. Patellofemoral joint space narrowing. Electronically Signed   By: Signa Kell M.D.   On: 07/23/2022 07:28    Procedures .Joint Aspiration/Arthrocentesis  Date/Time: 07/23/2022 8:44 AM  Performed by: Rolan Bucco, MD Authorized by: Rolan Bucco, MD   Consent:    Consent obtained:  Written   Consent given by:  Patient   Risks discussed:  Bleeding, infection, incomplete drainage and pain   Alternatives discussed:  No treatment Universal protocol:    Procedure explained and questions answered to patient or proxy's satisfaction: yes     Imaging studies available: yes     Immediately prior to procedure, a time out was called: yes     Patient identity confirmed:  Arm band Location:    Location:  Knee Anesthesia:    Anesthesia method:  Local infiltration   Local anesthetic:  Lidocaine 1% w/o epi Procedure details:    Preparation: Patient was prepped and draped in usual sterile fashion     Needle gauge:  60 G   Ultrasound guidance: no     Approach:  Medial   Aspirate characteristics:  Purulent   Steroid injected: no     Specimen collected: yes   Post-procedure details:    Dressing:  Adhesive bandage   Procedure completion:  Tolerated well, no immediate complications Comments:     As I was aspirating, about 2-3cc of white,  purlulent drainage was obtained.  Needle retracted.       Medications Ordered in ED Medications  vancomycin (VANCOCIN) IVPB 1000 mg/200 mL premix (has no administration in time range)  morphine (PF) 4 MG/ML injection 6 mg (6 mg Intramuscular Given 07/23/22 0816)  lidocaine (PF) (XYLOCAINE) 1 % injection 5 mL (5 mLs Infiltration Given 07/23/22 0818)  cefTRIAXone (ROCEPHIN) 1 g in sodium chloride 0.9 % 100 mL IVPB (1 g Intravenous New Bag/Given 07/23/22 0927)    ED Course/ Medical Decision Making/ A&P                             Medical Decision Making Amount and/or Complexity of Data Reviewed Labs: ordered. Radiology: ordered.  Risk Prescription drug management. Decision regarding hospitalization.   Patient is a 62 year old who presents with pain in his right knee.  He has some diffuse warmth and redness to the knee with some mild swelling and effusion noted.  No palpable abscess is noted.  No skin wounds are noted.  X-rays were obtained which showed some fragmentation of the prior hardware that was placed in 2014.  Small suprapatellar effusion is noted.  This was interpreted by me and confirmed by the radiologist.  He seems to have diffuse tenderness throughout the knee.  Differential diagnosis includes septic joint versus inflammatory arthritis versus gout.  After discussion with the patient, decision was made for arthrocentesis.  During the arthrocentesis, frank pus was appreciated, likely more superficial to the knee joint although not entirely clear.  Only about 2 to 3 cc were obtained.  Lab was advised to prioritize Gram stain and culture.  I spoke with Dr. Lyla Glassing who is on-call for [facility] who recommends doing a CT of the knee and admitting to the hospitalist service.  Will possibly plan 4 OR washout and revision next week.  He advises to start Rocephin and vancomycin.  This was ordered in the ED.  I discussed with Dr. Marylyn Ishihara who will admit the pt.  Final Clinical Impression(s) /  ED Diagnoses Final diagnoses:  Pyogenic arthritis of right knee joint, due to unspecified organism [facility])    Rx / DC Orders ED Discharge Orders     None         Malvin Johns, MD 07/23/22 1018

## 2022-07-23 NOTE — Assessment & Plan Note (Signed)
Patient states he has been drinking much less.  Denies any history of withdrawals.  Monitor for any sign of withdrawal while hospitalized

## 2022-07-23 NOTE — Assessment & Plan Note (Signed)
Reports cough worse for the past few months and weight loss  Will obtain CXR Pt is a heavy smoker

## 2022-07-24 ENCOUNTER — Observation Stay (HOSPITAL_COMMUNITY): Payer: Medicaid Other

## 2022-07-24 DIAGNOSIS — M1A0721 Idiopathic chronic gout, left ankle and foot, with tophus (tophi): Secondary | ICD-10-CM | POA: Diagnosis present

## 2022-07-24 DIAGNOSIS — M25461 Effusion, right knee: Secondary | ICD-10-CM | POA: Diagnosis present

## 2022-07-24 DIAGNOSIS — Z885 Allergy status to narcotic agent status: Secondary | ICD-10-CM | POA: Diagnosis not present

## 2022-07-24 DIAGNOSIS — F1010 Alcohol abuse, uncomplicated: Secondary | ICD-10-CM | POA: Diagnosis present

## 2022-07-24 DIAGNOSIS — F17210 Nicotine dependence, cigarettes, uncomplicated: Secondary | ICD-10-CM | POA: Diagnosis present

## 2022-07-24 DIAGNOSIS — M1009 Idiopathic gout, multiple sites: Secondary | ICD-10-CM | POA: Diagnosis present

## 2022-07-24 DIAGNOSIS — M1A9XX1 Chronic gout, unspecified, with tophus (tophi): Secondary | ICD-10-CM | POA: Diagnosis present

## 2022-07-24 DIAGNOSIS — Z79899 Other long term (current) drug therapy: Secondary | ICD-10-CM

## 2022-07-24 DIAGNOSIS — E440 Moderate protein-calorie malnutrition: Secondary | ICD-10-CM | POA: Diagnosis present

## 2022-07-24 DIAGNOSIS — M009 Pyogenic arthritis, unspecified: Secondary | ICD-10-CM | POA: Diagnosis not present

## 2022-07-24 DIAGNOSIS — Z6821 Body mass index (BMI) 21.0-21.9, adult: Secondary | ICD-10-CM | POA: Diagnosis not present

## 2022-07-24 DIAGNOSIS — M25561 Pain in right knee: Secondary | ICD-10-CM | POA: Diagnosis present

## 2022-07-24 LAB — CBC
HCT: 37 % — ABNORMAL LOW (ref 39.0–52.0)
Hemoglobin: 12 g/dL — ABNORMAL LOW (ref 13.0–17.0)
MCH: 28.7 pg (ref 26.0–34.0)
MCHC: 32.4 g/dL (ref 30.0–36.0)
MCV: 88.5 fL (ref 80.0–100.0)
Platelets: 273 10*3/uL (ref 150–400)
RBC: 4.18 MIL/uL — ABNORMAL LOW (ref 4.22–5.81)
RDW: 13.6 % (ref 11.5–15.5)
WBC: 8.3 10*3/uL (ref 4.0–10.5)
nRBC: 0 % (ref 0.0–0.2)

## 2022-07-24 LAB — PROTIME-INR
INR: 1.1 (ref 0.8–1.2)
Prothrombin Time: 13.9 seconds (ref 11.4–15.2)

## 2022-07-24 LAB — SURGICAL PCR SCREEN
MRSA, PCR: NEGATIVE
Staphylococcus aureus: POSITIVE — AB

## 2022-07-24 LAB — COMPREHENSIVE METABOLIC PANEL
ALT: 14 U/L (ref 0–44)
AST: 16 U/L (ref 15–41)
Albumin: 3 g/dL — ABNORMAL LOW (ref 3.5–5.0)
Alkaline Phosphatase: 67 U/L (ref 38–126)
Anion gap: 8 (ref 5–15)
BUN: 14 mg/dL (ref 8–23)
CO2: 28 mmol/L (ref 22–32)
Calcium: 8.4 mg/dL — ABNORMAL LOW (ref 8.9–10.3)
Chloride: 101 mmol/L (ref 98–111)
Creatinine, Ser: 0.89 mg/dL (ref 0.61–1.24)
GFR, Estimated: >60 mL/min (ref >60)
Glucose, Bld: 161 mg/dL — ABNORMAL HIGH (ref 70–99)
Potassium: 3.7 mmol/L (ref 3.5–5.1)
Sodium: 137 mmol/L (ref 135–145)
Total Bilirubin: 0.4 mg/dL (ref 0.3–1.2)
Total Protein: 6.4 g/dL — ABNORMAL LOW (ref 6.5–8.1)

## 2022-07-24 LAB — LACTIC ACID, PLASMA: Lactic Acid, Venous: 1.2 mmol/L (ref 0.5–1.9)

## 2022-07-24 LAB — PHOSPHORUS: Phosphorus: 4.2 mg/dL (ref 2.5–4.6)

## 2022-07-24 LAB — PROCALCITONIN: Procalcitonin: <0.1 ng/mL

## 2022-07-24 LAB — MAGNESIUM: Magnesium: 2.1 mg/dL (ref 1.7–2.4)

## 2022-07-24 MED ORDER — SODIUM CHLORIDE 0.9 % IV SOLN: INTRAVENOUS | Status: DC | PRN

## 2022-07-24 MED ORDER — KETOROLAC TROMETHAMINE 15 MG/ML IJ SOLN: 7.5000 mg | Freq: Four times a day (QID) | INTRAMUSCULAR | Status: DC

## 2022-07-24 MED ORDER — IPRATROPIUM-ALBUTEROL 0.5-2.5 (3) MG/3ML IN SOLN: 3.0000 mL | Freq: Two times a day (BID) | RESPIRATORY_TRACT | Status: DC

## 2022-07-24 MED ORDER — IPRATROPIUM-ALBUTEROL 0.5-2.5 (3) MG/3ML IN SOLN
3.0000 mL | Freq: Three times a day (TID) | RESPIRATORY_TRACT | Status: DC
  Administered 2022-07-24: 3 mL via RESPIRATORY_TRACT
  Filled 2022-07-24: qty 3

## 2022-07-24 MED ORDER — COLCHICINE 0.6 MG PO TABS
0.6000 mg | ORAL_TABLET | Freq: Two times a day (BID) | ORAL | Status: DC
  Administered 2022-07-24 – 2022-07-26 (×5): 0.6 mg via ORAL
  Filled 2022-07-24 (×5): qty 1

## 2022-07-24 MED ORDER — OXYCODONE-ACETAMINOPHEN 5-325 MG PO TABS
1.0000 | ORAL_TABLET | ORAL | Status: DC | PRN
  Administered 2022-07-24 – 2022-07-25 (×3): 1 via ORAL
  Filled 2022-07-24 (×3): qty 1

## 2022-07-24 MED ORDER — PNEUMOCOCCAL 20-VAL CONJ VACC 0.5 ML IM SUSY
0.5000 mL | PREFILLED_SYRINGE | INTRAMUSCULAR | Status: DC
  Filled 2022-07-24: qty 0.5

## 2022-07-24 MED ORDER — KETOROLAC TROMETHAMINE 15 MG/ML IJ SOLN
15.0000 mg | Freq: Four times a day (QID) | INTRAMUSCULAR | Status: DC
  Administered 2022-07-24 – 2022-07-26 (×9): 15 mg via INTRAVENOUS
  Filled 2022-07-24 (×9): qty 1
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
## 2022-07-24 NOTE — Plan of Care (Signed)
  Problem: Education: Goal: Knowledge of General Education information will improve Description: Including pain rating scale, medication(s)/side effects and non-pharmacologic comfort measures Outcome: Progressing

## 2022-07-24 NOTE — Progress Notes (Signed)
[facility] Progress Note Patient: [name] [name] UUV:[id number] DOB: Dec 07, 1960 DOA: 07/23/2022  DOS: the patient was seen and examined on 07/24/2022  Brief hospital course: PMH of active smoker gout, alcohol use, right patellar ORIF followed by tendon repair.  Presented to hospital with complaints of pain involving the right knee with swelling.  Bedside needle aspiration of the right knee is growing urate crystals.  Given his history there is concern for septic arthritis and therefore patient is currently on IV antibiotics.  ID and orthopedic both consulted and following. Assessment and Plan: Acute right knee gout. Patient has history of gout. Left great toe hide gout in December 2023. Treated conservatively. X-ray foot this admission on left shows presence of tophaceous gout. No evidence of acute gout there but now presents with right knee swelling and pain. Needle aspiration consistent with extracellular monosodium urate crystals typical for gout. Unable to perform cell count. Cultures so far negative. Scheduled Toradol and colchicine initiated on 1/21. Will monitor response. Due to concern for septic arthritis currently on IV ceftriaxone and vancomycin. ID following. Appreciate orthopedic consultation as well.  Active smoker. Chronic cough. Possible acute bronchitis. Chest x-ray shows possible bronchitis. Already on antibiotic. Mucinex added. Also on DuoNebs. Will monitor. No wheezing at the time of my evaluation. Refusing nicotine patch.  Alcohol use. Drinks 1 pint wine daily lately drinking less. Monitor.  Pain control. Patient tells me [name] [name] is not working for his pain. Will discontinue that medication switch to Percocet only. Keep morphine as needed. Scheduled Toradol.   Subjective: No nausea no vomiting.  No fever no chills.  Tolerating oral diet.  Pain still present.  Physical Exam: General: in moderate distress, No Rash Cardiovascular: S1 and S2  Present, No Murmur Respiratory: Good respiratory effort, Bilateral Air entry present. No Crackles, No wheezes Abdomen: Bowel Sound present, No tenderness Extremities: Trace right-sided edema Neuro: Alert and oriented x3, no new focal deficit  Data Reviewed: I have Reviewed nursing notes, Vitals, and Lab results. Since last encounter, pertinent lab results CBC and BMP   . I have ordered test including CBC and BMP  .   Disposition: Status is: Observation  SCDs Start: 07/23/22 1939   Family Communication: No one at bedside Level of care: Med-Surg  Vitals:   07/24/22 0402 07/24/22 0819 07/24/22 0821 07/24/22 1300  BP: 115/74  117/75 109/71  Pulse: 66  64 74  Resp: 16  18 14   Temp: 98.2 F (36.8 C)  97.9 F (36.6 C) 97.7 F (36.5 C)  TempSrc:   Oral   SpO2: 97% 97% 100% 99%  Weight:      Height:         Author: Berle Mull, MD 07/24/2022 1:46 PM  Please look on [email or web address] to find out who is on call.

## 2022-07-24 NOTE — Hospital Course (Signed)
PMH of active smoker gout, alcohol use, right patellar ORIF followed by tendon repair.  Presented to hospital with complaints of pain involving the right knee with swelling.  Bedside needle aspiration of the right knee is growing urate crystals.  Given his history there is concern for septic arthritis and therefore patient is currently on IV antibiotics.  ID and orthopedic both consulted and following.
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
## 2022-07-25 DIAGNOSIS — M1009 Idiopathic gout, multiple sites: Secondary | ICD-10-CM | POA: Diagnosis not present

## 2022-07-25 DIAGNOSIS — E440 Moderate protein-calorie malnutrition: Secondary | ICD-10-CM | POA: Diagnosis present

## 2022-07-25 MED ORDER — ENSURE ENLIVE PO LIQD
237.0000 mL | Freq: Two times a day (BID) | ORAL | Status: DC
  Administered 2022-07-25 – 2022-07-26 (×2): 237 mL via ORAL

## 2022-07-25 MED ORDER — ADULT MULTIVITAMIN W/MINERALS CH
1.0000 | ORAL_TABLET | Freq: Every day | ORAL | Status: DC
  Administered 2022-07-26: 1 via ORAL
  Filled 2022-07-25: qty 1

## 2022-07-25 MED ORDER — GUAIFENESIN ER 600 MG PO TB12
600.0000 mg | ORAL_TABLET | Freq: Two times a day (BID) | ORAL | Status: DC
  Administered 2022-07-25 – 2022-07-26 (×3): 600 mg via ORAL
  Filled 2022-07-25 (×3): qty 1

## 2022-07-25 MED ORDER — GUAIFENESIN-DM 100-10 MG/5ML PO SYRP
5.0000 mL | ORAL_SOLUTION | ORAL | Status: DC | PRN
  Administered 2022-07-25: 5 mL via ORAL
  Filled 2022-07-25: qty 10

## 2022-07-25 MED ORDER — IPRATROPIUM-ALBUTEROL 0.5-2.5 (3) MG/3ML IN SOLN
3.0000 mL | Freq: Two times a day (BID) | RESPIRATORY_TRACT | Status: DC
  Administered 2022-07-25: 3 mL via RESPIRATORY_TRACT
  Filled 2022-07-25: qty 3

## 2022-07-25 MED ORDER — DM-GUAIFENESIN ER 30-600 MG PO TB12: 1.0000 | ORAL_TABLET | Freq: Two times a day (BID) | ORAL | Status: DC

## 2022-07-25 MED ORDER — VANCOMYCIN HCL 1750 MG/350ML IV SOLN
1750.0000 mg | INTRAVENOUS | Status: DC
  Administered 2022-07-25 – 2022-07-26 (×2): 1750 mg via INTRAVENOUS
  Filled 2022-07-25 (×2): qty 350

## 2022-07-25 MED ORDER — GUAIFENESIN 100 MG/5ML PO LIQD: 5.0000 mL | ORAL | Status: DC | PRN

## 2022-07-25 NOTE — Progress Notes (Signed)
Initial Nutrition Assessment  DOCUMENTATION CODES:   Non-severe (moderate) malnutrition in context of chronic illness  INTERVENTION:  - Heart Healthy diet.  - Ensure Plus High Protein po BID, each supplement provides 350 kcal and 20 grams of protein. - Will add daily multivitamin to support micronutrient needs. - Monitor weight trends.   NUTRITION DIAGNOSIS:   Moderate Malnutrition related to chronic illness as evidenced by moderate fat depletion, moderate muscle depletion.  GOAL:   Patient will meet greater than or equal to 90% of their needs  MONITOR:   PO intake, Supplement acceptance, Weight trends  REASON FOR ASSESSMENT:   Malnutrition Screening Tool    ASSESSMENT:   62 y.o. male with medical history significant of Gout, chronic pain, Anemia, hx of Tobacco abuse and EToh Abuse who presented with 4-day history right knee pain and swelling  found to have gout flare up.  Patient reports UBW of 150-155# and weight loss over the past year due to decreased appetite. Per EMR, no significant changes in weight over the past year.  Patient endorses he will sometimes go 2 or 3 days without eating at home. When he does eat may only have a meal of a sandwich. Was not drinking any nutrition supplements PTA, notes they can be expensive.  He has been eating fairly well since admission, notes he had 2 meals yesterday and had all his breakfast this morning of french toast, scrambled eggs, and potatoes. Patient had Ensure Max in room and notes he drank all of it and enjoyed. Agreeable to receive supplements to support intake during admission.  Medications reviewed and include: Vancomycin  Labs reviewed:  -   NUTRITION - FOCUSED PHYSICAL EXAM:  Flowsheet Row Most Recent Value  Orbital Region Moderate depletion  Upper Arm Region Moderate depletion  Thoracic and Lumbar Region Moderate depletion  Buccal Region Severe depletion  Temple Region Severe depletion  Clavicle Bone Region  Moderate depletion  Clavicle and Acromion Bone Region Moderate depletion  Scapular Bone Region Unable to assess  Dorsal Hand Moderate depletion  Patellar Region Severe depletion  Anterior Thigh Region Severe depletion  Posterior Calf Region Moderate depletion  Edema (RD Assessment) None  Hair Reviewed  Eyes Reviewed  Mouth Reviewed  Skin Reviewed  Nails Reviewed       Diet Order:   Diet Order             Diet Heart Room service appropriate? Yes; Fluid consistency: Thin  Diet effective now                   EDUCATION NEEDS:  Education needs have been addressed  Skin:  Skin Assessment: Reviewed RN Assessment  Last BM:  1/20  Height:  Ht Readings from Last 1 Encounters:  07/23/22 5\' 10"  (1.778 m)   Weight:  Wt Readings from Last 1 Encounters:  07/23/22 68 kg   BMI:  Body mass index is 21.52 kg/m.  Estimated Nutritional Needs:  Kcal:  2050-2200 kcals Protein:  80-95 grams Fluid:  >/= 2L    Samson Frederic RD, LDN For contact information, refer to [facility].

## 2022-07-25 NOTE — Progress Notes (Signed)
Pharmacy Antibiotic Note  [name] [name] is a 62 y.o. male admitted on 07/23/2022 with septic joint of right knee, cellulitis. Pharmacy has been consulted for Vancomycin dosing.  Plan: Adjust vancomycin to 1750mg  IV q24h for estimated AUC 494 using TBW, SCr 0.89, Vd 0.72 Vancomycin levels at steady state, as indicated, goal AUC 400-550 CTX 2g IV q24h Monitor renal function, cultures, clinical course  Height: 5\' 10"  (177.8 cm) Weight: 68 kg (150 lb) IBW/kg (Calculated) : 73  Temp (24hrs), Avg:98 F (36.7 C), Min:97.7 F (36.5 C), Max:98.6 F (37 C)  Recent Labs  Lab 07/23/22 0913 07/23/22 1956 07/23/22 2340  WBC 9.1  --  8.3  CREATININE 0.75  --  0.89  LATICACIDVEN  --  1.1 1.2     Estimated Creatinine Clearance: 81.7 mL/min (by C-G formula based on SCr of 0.89 mg/dL).    Allergies  Allergen Reactions   Phenobarbital Other (See Comments)    Go wild.    Antimicrobials this admission: 1/20 Ceftriaxone >> 1/20 Vancomycin >>   Dose adjustments this admission: 1/22 adjust from 1g q12h to 1750mg  q24h for SCr 0.8 to 0.89   Microbiology results: 1/20 right knee fluid: NGTD  1/20 Staph aureus PCR: positive  Thank you for allowing pharmacy to be a part of this patient's care.  Peggyann Juba, PharmD, BCPS Pharmacy: [phone] 07/25/2022 7:39 AM

## 2022-07-25 NOTE — Progress Notes (Signed)
Mobility Specialist - Progress Note   07/25/22 1017  Mobility  Activity Ambulated with assistance in hallway  Level of Assistance Modified independent, requires aide device or extra time  Assistive Device Other (Comment) (IV Pole)  Distance Ambulated (ft) 450 ft  Activity Response Tolerated well  Mobility Referral Yes  $Mobility charge 1 Mobility   Pt received EOB and agreeable to mobility. No complaints during session. Pt to bed after session with all needs met.    [facility]

## 2022-07-25 NOTE — Progress Notes (Signed)
[facility] Progress Note Patient: [name] [name] DDU:[id number] DOB: 1961/04/16 DOA: 07/23/2022  DOS: the patient was seen and examined on 07/25/2022  Brief hospital course: PMH of active smoker gout, alcohol use, right patellar ORIF followed by tendon repair.  Presented to hospital with complaints of pain involving the right knee with swelling.  Bedside needle aspiration of the right knee is growing urate crystals.  Given his history there is concern for septic arthritis and therefore patient is currently on IV antibiotics.  ID and orthopedic both consulted and following. Assessment and Plan: Acute right knee gout. Patient has history of gout. Left great toe hide gout in December 2023. Treated conservatively. X-ray foot this admission on left shows presence of tophaceous gout. No evidence of acute gout there but now presents with right knee swelling and pain. Needle aspiration consistent with extracellular monosodium urate crystals typical for gout. Unable to perform cell count. Cultures so far negative. Scheduled Toradol and colchicine initiated on 1/21. Will monitor response. Due to concern for septic arthritis currently on IV ceftriaxone and vancomycin. ID following. Appreciate orthopedic consultation as well.   Active smoker. Chronic cough. Possible acute bronchitis. Chest x-ray shows possible bronchitis. Already on antibiotic. Mucinex added. Also on DuoNebs. Will monitor. No wheezing at the time of my evaluation. Refusing nicotine patch.   Alcohol use. Drinks 1 pint wine daily lately drinking less. Monitor.   Pain control. As needed Percocet Keep morphine as needed. Scheduled Toradol.  Moderate malnutrition. Body mass index is 21.52 kg/m. Nutrition Problem: Moderate Malnutrition Etiology: chronic illness Nutrition Interventions: Interventions: Ensure Enlive (each supplement provides 350kcal and 20 grams of protein), Refer to RD note for recommendations    Subjective: No nausea no vomiting no fever no chills.  No chest pain.  No abdominal pain.  Physical Exam: General: in Mild distress, No Rash Cardiovascular: S1 and S2 Present, No Murmur Respiratory: Good respiratory effort, Bilateral Air entry present. No Crackles, No wheezes Abdomen: Bowel Sound present, No tenderness Extremities: No edema, redness involving the right knee is significantly better. Neuro: Alert and oriented x3, no new focal deficit  Data Reviewed: I have Reviewed nursing notes, Vitals, and Lab results. Since last encounter, pertinent lab results CBC and BMP   . I have ordered test including BMP  .   Disposition: Status is: Inpatient Remains inpatient appropriate because: Continue IV antibiotics.  Clearance from ID and Ortho  SCDs Start: 07/23/22 1939   Family Communication: No one at bedside Level of care: Med-Surg  Vitals:   07/24/22 2019 07/25/22 0116 07/25/22 0459 07/25/22 0611  BP: 135/75 128/67 108/70   Pulse: 82 85 (!) 57   Resp: 16 18 18    Temp: 98.1 F (36.7 C) 98.6 F (37 C) 97.8 F (36.6 C)   TempSrc:  Oral Oral   SpO2: 90% 98% 100% 100%  Weight:      Height:         Author: Berle Mull, MD 07/25/2022 5:08 PM  Please look on [email or web address] to find out who is on call.
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
## 2022-07-25 NOTE — Progress Notes (Signed)
Patient ID: [name] [name], male   DOB: [birth year], 62 y.o.   MRN: [id number] Subjective:       Patient reports pain as moderate.  Thinks his pain level is better.  Able to move left great toe and right knee better with less pain but persistent tightness  Objective:   VITALS:   Vitals:   07/25/22 0459 07/25/22 0611  BP: 108/70   Pulse: (!) 57   Resp: 18   Temp: 97.8 F (36.6 C)   SpO2: 100% 100%    Neurovascular intact Left great toe and right knee still tender Decreased erythema Able to flex knee to close to 90 degrees with tightness  LABS Recent Labs    07/23/22 0913 07/23/22 2340  HGB 12.2* 12.0*  HCT 37.2* 37.0*  WBC 9.1 8.3  PLT 252 273    Recent Labs    07/23/22 0913 07/23/22 2340  NA 136 137  K 4.0 3.7  BUN 13 14  CREATININE 0.75 0.89  GLUCOSE 123* 161*    Recent Labs    07/23/22 2340  INR 1.1     Assessment/Plan:    Gouty flare up in left great toe and likely right knee versus infectious etiology  Plan: Continue to treat with Toradol scheduled and colchicine for now  Once Toradol is complete switch then to PO celecoxib until symptoms resolve. Follow up with PCP for long term management of Gout  Will follow cultures but as of now negative growth

## 2022-07-26 DIAGNOSIS — M1009 Idiopathic gout, multiple sites: Secondary | ICD-10-CM | POA: Diagnosis not present

## 2022-07-26 LAB — BASIC METABOLIC PANEL
Anion gap: 7 (ref 5–15)
BUN: 22 mg/dL (ref 8–23)
CO2: 24 mmol/L (ref 22–32)
Calcium: 8.4 mg/dL — ABNORMAL LOW (ref 8.9–10.3)
Chloride: 110 mmol/L (ref 98–111)
Creatinine, Ser: 1 mg/dL (ref 0.61–1.24)
GFR, Estimated: >60 mL/min (ref >60)
Glucose, Bld: 102 mg/dL — ABNORMAL HIGH (ref 70–99)
Potassium: 4.3 mmol/L (ref 3.5–5.1)
Sodium: 141 mmol/L (ref 135–145)

## 2022-07-26 LAB — CBC
HCT: 36.8 % — ABNORMAL LOW (ref 39.0–52.0)
Hemoglobin: 11.6 g/dL — ABNORMAL LOW (ref 13.0–17.0)
MCH: 27.8 pg (ref 26.0–34.0)
MCHC: 31.5 g/dL (ref 30.0–36.0)
MCV: 88.2 fL (ref 80.0–100.0)
Platelets: 278 10*3/uL (ref 150–400)
RBC: 4.17 MIL/uL — ABNORMAL LOW (ref 4.22–5.81)
RDW: 13.6 % (ref 11.5–15.5)
WBC: 6.7 10*3/uL (ref 4.0–10.5)
nRBC: 0 % (ref 0.0–0.2)

## 2022-07-26 LAB — MAGNESIUM: Magnesium: 2.2 mg/dL (ref 1.7–2.4)

## 2022-07-26 MED ORDER — ENSURE ENLIVE PO LIQD: 237.0000 mL | Freq: Two times a day (BID) | ORAL | 0 refills | Status: AC

## 2022-07-26 MED ORDER — CEFADROXIL 500 MG PO CAPS: 500.0000 mg | ORAL_CAPSULE | Freq: Two times a day (BID) | ORAL | 0 refills | Status: AC

## 2022-07-26 MED ORDER — COLCHICINE 0.6 MG PO TABS: 0.6000 mg | ORAL_TABLET | Freq: Two times a day (BID) | ORAL | 0 refills | Status: AC

## 2022-07-26 MED ORDER — NICOTINE 21 MG/24HR TD PT24: 21.0000 mg | MEDICATED_PATCH | Freq: Every day | TRANSDERMAL | 0 refills | Status: AC

## 2022-07-26 MED ORDER — COLCHICINE 0.6 MG PO TABS: 0.6000 mg | ORAL_TABLET | Freq: Two times a day (BID) | ORAL | 0 refills | Status: DC | PRN

## 2022-07-26 MED ORDER — DOXYCYCLINE HYCLATE 100 MG PO TBEC: 100.0000 mg | DELAYED_RELEASE_TABLET | Freq: Two times a day (BID) | ORAL | 0 refills | Status: AC

## 2022-07-26 MED ORDER — HYDROCODONE-ACETAMINOPHEN 5-325 MG PO TABS: 1.0000 | ORAL_TABLET | Freq: Three times a day (TID) | ORAL | 0 refills | Status: DC | PRN

## 2022-07-26 MED ORDER — NAPROXEN 500 MG PO TBEC: 500.0000 mg | DELAYED_RELEASE_TABLET | Freq: Two times a day (BID) | ORAL | 0 refills | Status: AC

## 2022-07-26 NOTE — Progress Notes (Signed)
  Transition of Care [facility]) Screening Note   Patient Details  Name: [name] [name] Date of Birth: [birth year]   Transition of Care [facility]) CM/SW Contact:    Lennart Pall, LCSW Phone Number: 07/26/2022, 8:35 AM    Transition of Care Department [facility]) has reviewed patient and no TOC needs have been identified at this time. We will continue to monitor patient advancement through interdisciplinary progression rounds. If new patient transition needs arise, please place a TOC consult.
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
## 2022-07-26 NOTE — Progress Notes (Signed)
Patient ID: DACE DENN, male   DOB: [birth year], 62 y.o.   MRN: [id number] Subjective:       Patient reports pain as mild, noting significant improvement.  He states he was able to walk yesterday. Interested in getting home today  Objective:   VITALS:   Vitals:   07/25/22 2103 07/26/22 0515  BP: (!) 138/90 (!) 151/91  Pulse: 71 65  Resp: 16 18  Temp: 98.5 F (36.9 C) (!) 97.5 F (36.4 C)  SpO2: 98% 98%    Exam: Right knee with significant reduction in erythema and warmth Chronic pre-tibia scar and protrusion No palpable effusion   Left great toe less tender    LABS Recent Labs    07/23/22 2340 07/26/22 0345  HGB 12.0* 11.6*  HCT 37.0* 36.8*  WBC 8.3 6.7  PLT 273 278    Recent Labs    07/23/22 2340 07/26/22 0345  NA 137 141  K 3.7 4.3  BUN 14 22  CREATININE 0.89 1.00  GLUCOSE 161* 102*    Recent Labs    07/23/22 2340  INR 1.1     Assessment/Plan:  Right knee and left great toe pain that appear more likely attributable to gout rather than infection  Cultures negative to date  Plan: Should be discharged on NSAID and colchicine to be used until symptoms gone He states he does not have a PCP so perhaps someone could help arrange follow up with [facility] We will sign off for now

## 2022-07-26 NOTE — Progress Notes (Signed)
Mobility Specialist - Progress Note   07/26/22 1359  Mobility  Activity Ambulated independently in hallway  Level of Assistance Independent  Assistive Device None  Distance Ambulated (ft) 700 ft  Activity Response Tolerated well  Mobility Referral Yes  $Mobility charge 1 Mobility   Pt received in bed and agreeable to mobility. No complaints during session. Pt to EOB after session with all needs met & nurse in room.    [facility]

## 2022-07-26 NOTE — Plan of Care (Signed)
Plan of care reviewed and discussed. °

## 2022-07-26 NOTE — TOC Progression Note (Signed)
Transition of Care [facility]) - Progression Note    Patient Details  Name: [name] [name] MRN: [id number] Date of Birth: 02/08/1961  Transition of Care [facility]) CM/SW Contact  Lennart Pall, LCSW Phone Number: 07/26/2022, 1:33 PM  Clinical Narrative:    TOC referral received to assist pt with PCP needs.  Pt reports that he has just started receiving Medicaid, however, has not established PCP.  He is grateful for CSW assist and no practice preference.  Able to secure new patient appt at [facility] on 08/02/22 at 10:15am with info placed on AVS.  No further TOC needs.        Expected Discharge Plan and Services                                               Social Determinants of Health (SDOH) Interventions SDOH Screenings   Food Insecurity: No Food Insecurity (07/23/2022)  Housing: Low Risk  (07/23/2022)  Transportation Needs: No Transportation Needs (07/23/2022)  Utilities: Not At Risk (07/23/2022)  Tobacco Use: High Risk (07/23/2022)    Readmission Risk Interventions     No data to display

## 2022-07-27 LAB — BODY FLUID CULTURE W GRAM STAIN
Culture: NO GROWTH
Special Requests: NORMAL

## 2022-07-28 MED FILL — Fentanyl Citrate Preservative Free (PF) Inj 100 MCG/2ML: INTRAMUSCULAR | Qty: 1 | Status: AC

## 2022-07-28 NOTE — Discharge Summary (Signed)
Physician Discharge Summary   Patient: [name] [name] MRN: [id number] DOB: 1961-05-04  Admit date:     07/23/2022  Discharge date: 07/26/2022  Discharge Physician: Lynden Oxford  PCP: Patient, No Pcp Per  Recommendations at discharge: Follow-up with PCP, currently scheduled an appointment with internal medicine clinic. Follow-up with orthopedics as needed.   Follow-up Information     [facility]. Go on 08/02/2022.   Why: 08/02/2022 10:15 AM Contact information: [address] [place] [address] [phone]        Social Security. Call.   Why: Call to begin process for social security Contact information: [phone] between 8:00 a.m. - 7:00 p.m. local time, Monday through Friday               Discharge Diagnoses: Principal Problem:   Polyarticular acute idiopathic gout Active Problems:   Septic joint of right knee joint (HCC)   Gout   Tobacco abuse   Alcohol abuse   Cough   Medication management   Malnutrition of moderate degree  Hospital Course: PMH of active smoker gout, alcohol use, right patellar ORIF followed by tendon repair.  Presented to hospital with complaints of pain involving the right knee with swelling.  Bedside needle aspiration of the right knee is growing urate crystals.  Given his history there is concern for septic arthritis and therefore patient is was on IV antibiotics.  ID and orthopedic both consulted and following.  Now discharging on oral antibiotic and treatment for gout.  Assessment and Plan Acute right knee gout. Left great toe gout in December 2023. Treated conservatively. X-ray foot this admission on left shows presence of tophaceous gout. No evidence of acute gout there but now presents with right knee swelling and pain. Needle aspiration consistent with extracellular monosodium urate crystals typical for gout. Unable to perform cell count. Cultures so far negative. Scheduled  Toradol and colchicine initiated on 1/21.  Tolerating well and actually showing improvement in knee swelling and redness. Due to concern for septic arthritis pt on IV ceftriaxone and vancomycin. ID following.  Recommended to switch to oral antibiotic on discharge.   Active smoker. Chronic cough. Possible acute bronchitis. Chest x-ray shows possible bronchitis. Already on antibiotic. Mucinex added. No wheezing at the time of my evaluation. Refusing nicotine patch.   Alcohol use. Drinks 1 pint wine daily lately drinking less.   Pain control. As needed Norco Scheduled Toradol.   Moderate malnutrition. Body mass index is 21.52 kg/m. Nutrition Problem: Moderate Malnutrition Etiology: chronic illness Nutrition Interventions: Interventions: Ensure Enlive (each supplement provides 350kcal and 20 grams of protein), Refer to RD note for recommendations    Pain control - [facility] Controlled Substance Reporting System database was reviewed. and patient was instructed, not to drive, operate heavy machinery, perform activities at heights, swimming or participation in water activities or provide baby-sitting services while on Pain, Sleep and Anxiety Medications; until their outpatient Physician has advised to do so again. Also recommended to not to take more than prescribed Pain, Sleep and Anxiety Medications.  Consultants:  Orthopedics ID  Procedures performed:  Bedside arthrocentesis.  DISCHARGE MEDICATION: Allergies as of 07/26/2022       Reactions   Phenobarbital Other (See Comments)   Go wild.        Medication List     STOP taking these medications    ibuprofen 200 MG tablet Commonly known as: ADVIL       TAKE these medications  cefadroxil 500 MG capsule Commonly known as: DURICEF Take 1 capsule (500 mg total) by mouth 2 (two) times daily for 6 days.   colchicine 0.6 MG tablet Take 1 tablet (0.6 mg total) by mouth 2 (two) times daily for 7 days.    colchicine 0.6 MG tablet Take 1 tablet (0.6 mg total) by mouth 2 (two) times daily as needed (for gout flare ups, take until symptoms have resolved.).   doxycycline 100 MG EC tablet Commonly known as: DORYX Take 1 tablet (100 mg total) by mouth 2 (two) times daily for 6 days.   feeding supplement Liqd Take 237 mLs by mouth 2 (two) times daily between meals.   HYDROcodone-acetaminophen 5-325 MG tablet Commonly known as: Norco Take 1 tablet by mouth every 8 (eight) hours as needed for moderate pain or severe pain. What changed:  when to take this reasons to take this   Mucinex Fast-Max Chest Cong MS 100 MG/5ML liquid Generic drug: guaiFENesin Take 10 mLs by mouth every 4 (four) hours as needed for cough or to loosen phlegm.   naproxen 500 MG EC tablet Commonly known as: EC NAPROSYN Take 1 tablet (500 mg total) by mouth 2 (two) times daily with a meal for 7 days.   nicotine 21 mg/24hr patch Commonly known as: NICODERM CQ - dosed in mg/24 hours Place 1 patch (21 mg total) onto the skin daily.       Disposition: Home Diet recommendation: Regular diet  Discharge Exam: Vitals:   07/25/22 0611 07/25/22 2103 07/26/22 0515 07/26/22 1327  BP:  (!) 138/90 (!) 151/91 139/88  Pulse:  71 65 78  Resp:  16 18 18   Temp:  98.5 F (36.9 C) (!) 97.5 F (36.4 C) 98 F (36.7 C)  TempSrc:   Oral Oral  SpO2: 100% 98% 98% 97%  Weight:      Height:       General: Appear in mild distress; no visible Abnormal Neck Mass Or lumps, Conjunctiva normal Cardiovascular: S1 and S2 Present, no Murmur, Respiratory: good respiratory effort, Bilateral Air entry present and CTA, no Crackles, no wheezes Abdomen: Bowel Sound present, Non tender  Extremities: no Pedal edema Neurology: alert and oriented to time, place, and person  [facility] Weights   07/23/22 0306  Weight: 68 kg   Condition at discharge: stable  The results of significant diagnostics from this hospitalization (including imaging,  microbiology, ancillary and laboratory) are listed below for reference.   Imaging Studies: DG Foot 2 Views Left  Result Date: 07/24/2022 CLINICAL DATA:  Left foot pain. EXAM: LEFT FOOT - 2 VIEW COMPARISON:  None Available. FINDINGS: No acute fracture or dislocation. No aggressive osseous lesion. Normal alignment. Small plantar calcaneal spur. Small area of mineralization along the medial aspect of the first metatarsal head as can be seen with tophaceous gout. Soft tissue are unremarkable. No radiopaque foreign body or soft tissue emphysema. IMPRESSION: 1.  No acute osseous injury of the left foot. 2. Small area of mineralization along the medial aspect of the first metatarsal head as can be seen with tophaceous gout. Electronically Signed   By: Kathreen Devoid M.D.   On: 07/24/2022 09:37   DG Chest 2 View  Result Date: 07/23/2022 CLINICAL DATA:  Cough EXAM: CHEST - 2 VIEW COMPARISON:  Chest x-ray 07/08/2021. CT of the chest 11/17/2019, report only. FINDINGS: The heart size and mediastinal contours are within normal limits. Both lungs are clear. There is some central peribronchial wall thickening. The visualized skeletal  structures are unremarkable. IMPRESSION: Central peribronchial wall thickening suggesting bronchitis. No focal airspace opacity or pleural effusion. Electronically Signed   By: Darliss Cheney M.D.   On: 07/23/2022 22:47   CT Knee Right Wo Contrast  Result Date: 07/23/2022 CLINICAL DATA:  Right knee pain. Possible infection. The patient has a fixation wire in the knee for prior patellar tendon repair. EXAM: CT OF THE RIGHT KNEE WITHOUT CONTRAST TECHNIQUE: Multidetector CT imaging of the right knee was performed according to the standard protocol. Multiplanar CT image reconstructions were also generated. RADIATION DOSE REDUCTION: This exam was performed according to the departmental dose-optimization program which includes automated exposure control, adjustment of the mA and/or kV according to  patient size and/or use of iterative reconstruction technique. COMPARISON:  Plain films right knee 07/23/2022 and 04/25/2013. FINDINGS: Bones/Joint/Cartilage As seen on the plain films today, there is a fracture fixation wire extending from the patellar tuberosity through the patellar tendon into the patella. Heterotopic ossification is present about the wire. No bony destructive change is identified. There is no fracture. Moderate joint effusion is seen. Synovium appears thickened. Ligaments Suboptimally assessed by CT. Muscles and Tendons No intramuscular fluid collection or mass. Soft tissues There is infiltration of subcutaneous fat about the anterior aspect of the knee and proximal tibia. No soft tissue gas is identified. IMPRESSION: Infiltration of subcutaneous fat about the anterior aspect of the knee is worrisome for cellulitis. Although no CT evidence of osteomyelitis is identified, the patient has a moderate joint effusion which could be septic or aseptic. Aspiration is recommended for further evaluation. Status post patellar tendon repair. As seen on plain films earlier today, the fixation wire is fractured and there is heterotopic ossification about the tendon. Electronically Signed   By: Drusilla Kanner M.D.   On: 07/23/2022 09:37   DG Knee Complete 4 Views Right  Result Date: 07/23/2022 CLINICAL DATA:  Swelling. Knee pain. Patient reports broken wire associated with surgery from 2014. EXAM: RIGHT KNEE - COMPLETE 4+ VIEW COMPARISON:  04/25/2013 FINDINGS: There is a small suprapatellar joint effusion. Interval fragmentation cerclage wire previously anchoring the patella with the tibial tubercle. There is overlying skin thickening along the anterior aspect of the knee. No sign of acute fracture or dislocation. Patellofemoral joint space narrowing noted. Chondrocalcinosis identified. IMPRESSION: 1. Small suprapatellar joint effusion. No acute fracture or dislocation. 2. Interval fragmentation of  cerclage wire previously anchoring the patella with the tibial tubercle. 3. Anterior skin thickening. 4. Chondrocalcinosis. 5. Patellofemoral joint space narrowing. Electronically Signed   By: Signa Kell M.D.   On: 07/23/2022 07:28    Microbiology: Results for orders placed or performed during the hospital encounter of 07/23/22  Body fluid culture w Gram Stain     Status: None   Collection Time: 07/23/22  8:18 AM   Specimen: KNEE; Body Fluid  Result Value Ref Range Status   Specimen Description   Final    KNEE Performed at [facility], [address], [place], [place] [address]    Special Requests   Final    Normal Performed at [facility], [address] [place], [place] [address]    Gram Stain   Final    FEW WBC PRESENT, PREDOMINANTLY PMN NO ORGANISMS SEEN    Culture   Final    NO GROWTH 3 DAYS Performed at [facility], [address], [place], [place] [address]    Report Status 07/27/2022 FINAL  Final  Surgical PCR screen  Status: Abnormal   Collection Time: 07/23/22 11:56 PM   Specimen: Nasal Mucosa; Nasal Swab  Result Value Ref Range Status   MRSA, PCR NEGATIVE NEGATIVE Final   Staphylococcus aureus POSITIVE (A) NEGATIVE Final    Comment: RESULT CALLED TO, READ BACK BY AND VERIFIED WITH: KMAPP,D AT 0417 ON 07/24/22 BY LUZOLOP (NOTE) The Xpert SA Assay (FDA approved for NASAL specimens in patients 47 years of age and older), is one component of a comprehensive surveillance program. It is not intended to diagnose infection nor to guide or monitor treatment. Performed at [facility], [place] [address], [place], [place] [address]    Labs: CBC: Recent Labs  Lab 07/23/22 0913 07/23/22 2340 07/26/22 0345  WBC 9.1 8.3 6.7  NEUTROABS 6.6  --   --   HGB 12.2* 12.0* 11.6*  HCT 37.2* 37.0* 36.8*  MCV 86.1 88.5 88.2  PLT 252 273 254   Basic Metabolic Panel: Recent Labs  Lab 07/23/22 0913  07/23/22 1956 07/23/22 2340 07/26/22 0345  NA 136  --  137 141  K 4.0  --  3.7 4.3  CL 102  --  101 110  CO2 27  --  28 24  GLUCOSE 123*  --  161* 102*  BUN 13  --  14 22  CREATININE 0.75  --  0.89 1.00  CALCIUM 9.1  --  8.4* 8.4*  MG  --  2.0 2.1 2.2  PHOS  --  3.9 4.2  --    Liver Function Tests: Recent Labs  Lab 07/23/22 2340  AST 16  ALT 14  ALKPHOS 67  BILITOT 0.4  PROT 6.4*  ALBUMIN 3.0*   CBG: No results for input(s): "GLUCAP" in the last 168 hours.  Discharge time spent: greater than 30 minutes.  Signed: Berle Mull, MD [facility] 07/26/2022

## 2022-08-02 ENCOUNTER — Ambulatory Visit: Payer: Medicaid Other | Admitting: Internal Medicine

## 2022-08-02 NOTE — Progress Notes (Deleted)
   CC: HFU  HPI:Mr.[name] [name] is a 62 y.o. male who presents for evaluation of ***. Please see individual problem based A/P for details.  19 yom with hx of polyarticular idiopathic gout , right patellar ORIF s/p tendon repar (when?) who presented to pain and selling of right knee  Gout but given hx there was  concern for septic arthiritis too, discharged on treatment for gout and oral antibiotic. - could check uric acid if not done recently - would start colchicine and allopurinol if multiple per year   Depression, PHQ-9: Based on the patients  score we have ***.  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   General: *** HEENT: Conjunctiva nl , antiicteric sclerae, moist mucous membranes, no exudate or erythema Cardiovascular: Normal rate, regular rhythm.  No murmurs, rubs, or gallops Pulmonary : Equal breath sounds, No wheezes, rales, or rhonchi Abdominal: soft, nontender,  bowel sounds present Ext: No edema in lower extremities, no tenderness to palpation of lower extremities.   Assessment & Plan:   See Encounters Tab for problem based charting.  Patient {GC/GE:[id number]::"discussed with","seen with"} Dr. {HBZJI:[id number]::"B. Hoffman","Guilloud","Mullen","Narendra","Raines","Vincent","Williams"}

## 2023-05-09 ENCOUNTER — Other Ambulatory Visit: Payer: Self-pay

## 2023-05-09 ENCOUNTER — Emergency Department (HOSPITAL_COMMUNITY)
Admission: EM | Admit: 2023-05-09 | Discharge: 2023-05-09 | Disposition: A | Discharge status: Home / Self Care | Payer: Commercial Managed Care - HMO | Attending: Emergency Medicine | Admitting: Emergency Medicine

## 2023-05-09 DIAGNOSIS — M79672 Pain in left foot: Secondary | ICD-10-CM | POA: Insufficient documentation

## 2023-05-09 DIAGNOSIS — M1049 Other secondary gout, multiple sites: Secondary | ICD-10-CM | POA: Insufficient documentation

## 2023-05-09 DIAGNOSIS — R059 Cough, unspecified: Secondary | ICD-10-CM | POA: Insufficient documentation

## 2023-05-09 MED ORDER — KETOROLAC TROMETHAMINE 15 MG/ML IJ SOLN
15.0000 mg | Freq: Once | INTRAMUSCULAR | Status: AC
  Administered 2023-05-09: 15 mg via INTRAMUSCULAR
  Filled 2023-05-09: qty 1

## 2023-05-09 MED ORDER — PREDNISONE 20 MG PO TABS: 40.0000 mg | ORAL_TABLET | Freq: Every day | ORAL | 0 refills | Status: DC

## 2023-05-09 NOTE — Discharge Instructions (Signed)
Please use Tylenol or ibuprofen for pain.  You may use 600 mg ibuprofen every 6 hours or 1000 mg of Tylenol every 6 hours.  You may choose to alternate between the 2.  This would be most effective.  Not to exceed 4 g of Tylenol within 24 hours.  Not to exceed 3200 mg ibuprofen 24 hours.  You can take the entire course of steroids that I prescribed to help with your gout.

## 2023-05-09 NOTE — ED Triage Notes (Signed)
Pt comes in for left foot pain for 4 days. Patient states it then moved to the right. He states he has a hx of gout. He states he has also had a dry cough for two weeks.  CSMs intact.

## 2023-05-09 NOTE — ED Provider Notes (Signed)
[place] EMERGENCY DEPARTMENT AT [facility] Provider Note   CSN: [id number] Arrival date & time: 05/09/23  0249     History  Chief Complaint  Patient presents with   Foot Pain   Cough    [name] [name] is a 62 y.o. male with past medical history significant for alcohol abuse, aggressive behavior, gout, tobacco abuse who presents with concern for left foot pain for 4 days, with now right foot pain as well.  He reports history of gout, reports feels similar to previous gout flare.  Also reports dry cough for 2 weeks, denies any chest pain, shortness of breath.  He reports difficulty walking secondary to pain.  He has not taken anything for pain.   Foot Pain  Cough      Home Medications Prior to Admission medications   Medication Sig Start Date End Date Taking? Authorizing Provider  predniSONE (DELTASONE) 20 MG tablet Take 2 tablets (40 mg total) by mouth daily. 05/09/23  Yes Brant Peets H, PA-C  colchicine 0.6 MG tablet Take 1 tablet (0.6 mg total) by mouth 2 (two) times daily for 7 days. 07/26/22 08/02/22  Rolly Salter, MD  colchicine 0.6 MG tablet Take 1 tablet (0.6 mg total) by mouth 2 (two) times daily as needed (for gout flare ups, take until symptoms have resolved.). 07/26/22 07/26/23  Rolly Salter, MD  feeding supplement (ENSURE ENLIVE / ENSURE PLUS) LIQD Take 237 mLs by mouth 2 (two) times daily between meals. 07/26/22   Rolly Salter, MD  guaiFENesin (MUCINEX FAST-MAX CHEST CONG MS) 100 MG/5ML liquid Take 10 mLs by mouth every 4 (four) hours as needed for cough or to loosen phlegm.    [provider]  HYDROcodone-acetaminophen (NORCO) 5-325 MG tablet Take 1 tablet by mouth every 8 (eight) hours as needed for moderate pain or severe pain. 07/26/22   Rolly Salter, MD  nicotine (NICODERM CQ - DOSED IN MG/24 HOURS) 21 mg/24hr patch Place 1 patch (21 mg total) onto the skin daily. 07/27/22   Rolly Salter, MD      Allergies     Phenobarbital    Review of Systems   Review of Systems  Respiratory:  Positive for cough.   All other systems reviewed and are negative.   Physical Exam Updated Vital Signs BP 110/66   Pulse 69   Temp 98.4 F (36.9 C) (Oral)   Resp 16   Ht 5\' 10"  (1.778 m)   Wt 61.2 kg   SpO2 100%   BMI 19.37 kg/m  Physical Exam Vitals and nursing note reviewed.  Constitutional:      General: He is not in acute distress.    Appearance: Normal appearance.  HENT:     Head: Normocephalic and atraumatic.  Eyes:     General:        Right eye: No discharge.        Left eye: No discharge.  Cardiovascular:     Rate and Rhythm: Normal rate and regular rhythm.     Heart sounds: No murmur heard.    No friction rub. No gallop.     Comments: DP, PT pulses 2+ bilateral lower extremities Pulmonary:     Effort: Pulmonary effort is normal.     Breath sounds: Normal breath sounds.  Abdominal:     General: Bowel sounds are normal.     Palpations: Abdomen is soft.  Musculoskeletal:     Comments: Redness, swelling noted around first  and second toes bilateral feet with tenderness to palpation.  Skin:    General: Skin is warm and dry.     Capillary Refill: Capillary refill takes less than 2 seconds.  Neurological:     Mental Status: He is alert and oriented to person, place, and time.  Psychiatric:        Mood and Affect: Mood normal.        Behavior: Behavior normal.     ED Results / Procedures / Treatments   Labs (all labs ordered are listed, but only abnormal results are displayed) Labs Reviewed - No data to display  EKG None  Radiology No results found.  Procedures Procedures    Medications Ordered in ED Medications  ketorolac (TORADOL) 15 MG/ML injection 15 mg (15 mg Intramuscular Given 05/09/23 0318)    ED Course/ Medical Decision Making/ A&P                                 Medical Decision Making Risk Prescription drug management.   This patient is a 62 y.o. male  who presents to the ED for concern of foot pain, difficulty walking.   Differential diagnoses prior to evaluation: Gout, fracture, dislocation, vs other  Past Medical History / Social History / Additional history: Chart reviewed. Pertinent results include: alcohol abuse, aggressive behavior, gout, tobacco abuse  Physical Exam: Physical exam performed. The pertinent findings include:  DP, PT pulses 2+ bilateral lower extremities Redness, swelling noted around first and second toes bilateral feet with tenderness to palpation.   Medications / Treatment: Toradol for pain, will discharge with prednisone for gout   Disposition: After consideration of the diagnostic results and the patients response to treatment, I feel that patient is stable for discharge, he has gout, neurovascularly intact lower extremities, stable vital signs, he reports difficulty walking but has intact ambulation, with decreased movement secondary to pain.   emergency department workup does not suggest an emergent condition requiring admission or immediate intervention beyond what has been performed at this time. The plan is: as above. The patient is safe for discharge and has been instructed to return immediately for worsening symptoms, change in symptoms or any other concerns.  Final Clinical Impression(s) / ED Diagnoses Final diagnoses:  Other secondary acute gout of multiple sites    Rx / DC Orders ED Discharge Orders          Ordered    predniSONE (DELTASONE) 20 MG tablet  Daily        05/09/23 0323              Olene Floss, PA-C 05/09/23 0341    Gilda Crease, MD 05/09/23 0700

## 2023-05-21 ENCOUNTER — Encounter (HOSPITAL_COMMUNITY): Payer: Self-pay

## 2023-05-21 ENCOUNTER — Emergency Department (HOSPITAL_COMMUNITY)
Admission: EM | Admit: 2023-05-21 | Discharge: 2023-05-21 | Disposition: A | Discharge status: Home / Self Care | Payer: Commercial Managed Care - HMO | Attending: Emergency Medicine | Admitting: Emergency Medicine

## 2023-05-21 ENCOUNTER — Emergency Department (HOSPITAL_COMMUNITY): Payer: Commercial Managed Care - HMO

## 2023-05-21 DIAGNOSIS — L0501 Pilonidal cyst with abscess: Secondary | ICD-10-CM | POA: Insufficient documentation

## 2023-05-21 DIAGNOSIS — L02416 Cutaneous abscess of left lower limb: Secondary | ICD-10-CM | POA: Diagnosis not present

## 2023-05-21 DIAGNOSIS — M25521 Pain in right elbow: Secondary | ICD-10-CM | POA: Diagnosis not present

## 2023-05-21 DIAGNOSIS — M25522 Pain in left elbow: Secondary | ICD-10-CM

## 2023-05-21 HISTORY — DX: Gout, unspecified: M10.9

## 2023-05-21 LAB — BASIC METABOLIC PANEL
Anion gap: 9 (ref 5–15)
BUN: 18 mg/dL (ref 8–23)
CO2: 29 mmol/L (ref 22–32)
Calcium: 9.1 mg/dL (ref 8.9–10.3)
Chloride: 99 mmol/L (ref 98–111)
Creatinine, Ser: 0.97 mg/dL (ref 0.61–1.24)
GFR, Estimated: >60 mL/min (ref >60)
Glucose, Bld: 100 mg/dL — ABNORMAL HIGH (ref 70–99)
Potassium: 3.9 mmol/L (ref 3.5–5.1)
Sodium: 137 mmol/L (ref 135–145)

## 2023-05-21 LAB — CBC WITH DIFFERENTIAL/PLATELET
Abs Immature Granulocytes: 0.03 10*3/uL (ref 0.00–0.07)
Basophils Absolute: 0 10*3/uL (ref 0.0–0.1)
Basophils Relative: 0 %
Eosinophils Absolute: 0.1 10*3/uL (ref 0.0–0.5)
Eosinophils Relative: 1 %
HCT: 40.2 % (ref 39.0–52.0)
Hemoglobin: 12.6 g/dL — ABNORMAL LOW (ref 13.0–17.0)
Immature Granulocytes: 0 %
Lymphocytes Relative: 16 %
Lymphs Abs: 1.6 10*3/uL (ref 0.7–4.0)
MCH: 27.8 pg (ref 26.0–34.0)
MCHC: 31.3 g/dL (ref 30.0–36.0)
MCV: 88.7 fL (ref 80.0–100.0)
Monocytes Absolute: 0.5 10*3/uL (ref 0.1–1.0)
Monocytes Relative: 5 %
Neutro Abs: 7.7 10*3/uL (ref 1.7–7.7)
Neutrophils Relative %: 78 %
Platelets: 317 10*3/uL (ref 150–400)
RBC: 4.53 MIL/uL (ref 4.22–5.81)
RDW: 13.5 % (ref 11.5–15.5)
WBC: 10 10*3/uL (ref 4.0–10.5)
nRBC: 0 % (ref 0.0–0.2)

## 2023-05-21 MED ORDER — DOXYCYCLINE HYCLATE 100 MG PO CAPS: 100.0000 mg | ORAL_CAPSULE | Freq: Two times a day (BID) | ORAL | 0 refills | Status: AC

## 2023-05-21 MED ORDER — OXYCODONE-ACETAMINOPHEN 5-325 MG PO TABS
1.0000 | ORAL_TABLET | Freq: Once | ORAL | Status: AC
  Administered 2023-05-21: 1 via ORAL
  Filled 2023-05-21: qty 1

## 2023-05-21 MED ORDER — OXYCODONE HCL 5 MG PO TABS: 5.0000 mg | ORAL_TABLET | ORAL | 0 refills | Status: AC | PRN

## 2023-05-21 MED ORDER — IBUPROFEN 200 MG PO TABS
600.0000 mg | ORAL_TABLET | Freq: Once | ORAL | Status: AC
  Administered 2023-05-21: 600 mg via ORAL
  Filled 2023-05-21: qty 3

## 2023-05-21 MED ORDER — PREDNISONE 20 MG PO TABS: 40.0000 mg | ORAL_TABLET | Freq: Every day | ORAL | 0 refills | Status: AC

## 2023-05-21 MED ORDER — OXYCODONE HCL 5 MG PO TABS
10.0000 mg | ORAL_TABLET | Freq: Once | ORAL | Status: AC
  Administered 2023-05-21: 10 mg via ORAL
  Filled 2023-05-21: qty 2

## 2023-05-21 MED ORDER — DOXYCYCLINE HYCLATE 100 MG PO TABS
100.0000 mg | ORAL_TABLET | Freq: Once | ORAL | Status: AC
  Administered 2023-05-21: 100 mg via ORAL
  Filled 2023-05-21: qty 1

## 2023-05-21 MED ORDER — LIDOCAINE-EPINEPHRINE (PF) 2 %-1:200000 IJ SOLN
10.0000 mL | Freq: Once | INTRAMUSCULAR | Status: AC
  Administered 2023-05-21: 10 mL via INTRADERMAL
  Filled 2023-05-21: qty 20

## 2023-05-21 NOTE — ED Provider Notes (Signed)
[facility] Provider Note   CSN: [id number] Arrival date & time: 05/21/23  1759     History  Chief Complaint  Patient presents with   Insect Bite   Elbow Pain    [name] [name] is a 62 y.o. male.  Patient to ED with multiple areas of what he thinks are insect bites. They are painful, raised, red areas to left buttock and left lower leg. No known fever. He tried to open one of the areas with a needle that is now more swollen and painful.   The history is provided by the patient. No language interpreter was used.       Home Medications Prior to Admission medications   Medication Sig Start Date End Date Taking? Authorizing Provider  colchicine 0.6 MG tablet Take 1 tablet (0.6 mg total) by mouth 2 (two) times daily for 7 days. 07/26/22 08/02/22  Rolly Salter, MD  colchicine 0.6 MG tablet Take 1 tablet (0.6 mg total) by mouth 2 (two) times daily as needed (for gout flare ups, take until symptoms have resolved.). 07/26/22 07/26/23  Rolly Salter, MD  feeding supplement (ENSURE ENLIVE / ENSURE PLUS) LIQD Take 237 mLs by mouth 2 (two) times daily between meals. 07/26/22   Rolly Salter, MD  guaiFENesin (MUCINEX FAST-MAX CHEST CONG MS) 100 MG/5ML liquid Take 10 mLs by mouth every 4 (four) hours as needed for cough or to loosen phlegm.    [provider]  HYDROcodone-acetaminophen (NORCO) 5-325 MG tablet Take 1 tablet by mouth every 8 (eight) hours as needed for moderate pain or severe pain. 07/26/22   Rolly Salter, MD  nicotine (NICODERM CQ - DOSED IN MG/24 HOURS) 21 mg/24hr patch Place 1 patch (21 mg total) onto the skin daily. 07/27/22   Rolly Salter, MD  predniSONE (DELTASONE) 20 MG tablet Take 2 tablets (40 mg total) by mouth daily. 05/09/23   Prosperi, Christian H, PA-C      Allergies    Phenobarbital    Review of Systems   Review of Systems  Physical Exam Updated Vital Signs BP 131/79 (BP Location: Right Arm)    Pulse 100   Temp 99.5 F (37.5 C) (Oral)   Resp 20   Ht 5\' 10"  (1.778 m)   Wt 61.2 kg   SpO2 100%   BMI 19.37 kg/m  Physical Exam Vitals and nursing note reviewed.  Constitutional:      Appearance: Normal appearance.  Cardiovascular:     Rate and Rhythm: Normal rate.  Pulmonary:     Effort: Pulmonary effort is normal.  Musculoskeletal:        General: Normal range of motion.  Skin:    General: Skin is warm and dry.     Findings: Erythema present.     Comments: Multiple single lesions that are raised, significantly tender, indurated - x1 posterior proximal lower leg; x3 left hip; large pilonidal abscess that is fluctuant.   Neurological:     Mental Status: He is alert and oriented to person, place, and time.     ED Results / Procedures / Treatments   Labs (all labs ordered are listed, but only abnormal results are displayed) Labs Reviewed  CBC WITH DIFFERENTIAL/PLATELET - Abnormal; Notable for the following components:      Result Value   Hemoglobin 12.6 (*)    All other components within normal limits  BASIC METABOLIC PANEL - Abnormal; Notable for the following components:  Glucose, Bld 100 (*)    All other components within normal limits   Results for orders placed or performed during the hospital encounter of 05/21/23  CBC with Differential  Result Value Ref Range   WBC 10.0 4.0 - 10.5 K/uL   RBC 4.53 4.22 - 5.81 MIL/uL   Hemoglobin 12.6 (L) 13.0 - 17.0 g/dL   HCT 98.1 19.1 - 47.8 %   MCV 88.7 80.0 - 100.0 fL   MCH 27.8 26.0 - 34.0 pg   MCHC 31.3 30.0 - 36.0 g/dL   RDW 29.5 62.1 - 30.8 %   Platelets 317 150 - 400 K/uL   nRBC 0.0 0.0 - 0.2 %   Neutrophils Relative % 78 %   Neutro Abs 7.7 1.7 - 7.7 K/uL   Lymphocytes Relative 16 %   Lymphs Abs 1.6 0.7 - 4.0 K/uL   Monocytes Relative 5 %   Monocytes Absolute 0.5 0.1 - 1.0 K/uL   Eosinophils Relative 1 %   Eosinophils Absolute 0.1 0.0 - 0.5 K/uL   Basophils Relative 0 %   Basophils Absolute 0.0 0.0 - 0.1  K/uL   Immature Granulocytes 0 %   Abs Immature Granulocytes 0.03 0.00 - 0.07 K/uL  Basic metabolic panel  Result Value Ref Range   Sodium 137 135 - 145 mmol/L   Potassium 3.9 3.5 - 5.1 mmol/L   Chloride 99 98 - 111 mmol/L   CO2 29 22 - 32 mmol/L   Glucose, Bld 100 (H) 70 - 99 mg/dL   BUN 18 8 - 23 mg/dL   Creatinine, Ser 6.57 0.61 - 1.24 mg/dL   Calcium 9.1 8.9 - 84.6 mg/dL   GFR, Estimated >96 >29 mL/min   Anion gap 9 5 - 15     EKG None  Radiology No results found.  Procedures .Marland KitchenIncision and Drainage  Date/Time: 05/21/2023 7:01 PM  Performed by: Elpidio Anis, PA-C Authorized by: Elpidio Anis, PA-C   Consent:    Consent obtained:  Verbal   Risks, benefits, and alternatives were discussed: yes   Universal protocol:    Procedure explained and questions answered to patient or proxy's satisfaction: yes     Patient identity confirmed:  Verbally with patient Location:    Type:  Pilonidal cyst Anesthesia:    Anesthesia method:  Local infiltration   Local anesthetic:  Lidocaine 2% WITH epi Procedure type:    Complexity:  Simple Procedure details:    Incision types:  Single straight   Drainage:  Bloody and purulent Post-procedure details:    Procedure completion:  Tolerated with difficulty     Medications Ordered in ED Medications  oxyCODONE-acetaminophen (PERCOCET/ROXICET) 5-325 MG per tablet 1 tablet (1 tablet Oral Given 05/21/23 1855)  ibuprofen (ADVIL) tablet 600 mg (600 mg Oral Given 05/21/23 1854)  doxycycline (VIBRA-TABS) tablet 100 mg (100 mg Oral Given 05/21/23 1855)  lidocaine-EPINEPHrine (XYLOCAINE W/EPI) 2 %-1:200000 (PF) injection 10 mL (10 mLs Intradermal Given by Other 05/21/23 1947)    ED Course/ Medical Decision Making/ A&P                                 Medical Decision Making This patient presents to the ED for concern of skin sores, this involves an extensive number of treatment options, and is a complaint that carries with it a high  risk of complications and morbidity.  The differential diagnosis includes disseminated infection, MRSA infection, abscesses, insect bites  Also reports onset right elbow pain yesterday. He went to sit down on a cough using his arms to lower him down and felt sudden onset pain in the right elbow. No cannot straighten the arm, hurts to hold objects with his right hand.    Co morbidities that complicate the patient evaluation  none   Additional history obtained:  Additional history obtained from N/A External records from outside source obtained and reviewed including Last seen 11/5 for gout affecting bilateral feet.    Lab Tests:  I Ordered, and personally interpreted labs.  The pertinent results include:  CBC, Bmet - no abnormalities.    Problem List / ED Course / Critical interventions / Medication management  Patient here with multiple painful areas that appear to be insect bites, possible MRSA abscesses. No fever. Basic labs pending. Pain and infection addressed.   I ordered medication including percocet  for pain  Reevaluation of the patient after these medicines showed that the patient improved I have reviewed the patients home medicines and have made adjustments as needed   Social Determinants of Health:  Poor hygiene, infrequent medical care   Test / Admission - Considered:  Pilonidal abscess opened per procedure note.  Xray of right elbow pending at time of sign out to [employer], PA-C.     Amount and/or Complexity of Data Reviewed Labs: ordered.  Risk OTC drugs. Prescription drug management.           Final Clinical Impression(s) / ED Diagnoses Final diagnoses:  Pilonidal abscess  Cutaneous abscess of left lower extremity  Elbow pain, right    Rx / DC Orders ED Discharge Orders     None         Elpidio Anis, PA-C 05/21/23 1956    Loetta Rough, MD 05/21/23 2215

## 2023-05-21 NOTE — ED Notes (Addendum)
7:17 PM  Lidocaine and suture cart brought to bedside. Allean Found, PA-C notified.

## 2023-05-21 NOTE — ED Notes (Addendum)
7:10 PM  Report received from previous RN. This RN assumes care of the patient.   9:24 PM  Discharge instructions discussed with patient and patient voices understanding of discharge instructions. Patient is stable at discharge and in NAD. Prescription and follow up information discussed with patient using teach back method. Patient voices understanding of discharge instructions. Patient to call his sister for transportation home. He denies any questions or concerns regarding discharge.

## 2023-05-21 NOTE — Discharge Instructions (Addendum)
You were seen in the emergency department for an abscess.  We have drained the area and cleaned it. I would like you to have the wound checked in 2-3 days. This can be done by any doctor's office, urgent care, or emergency department. This is to make sure the area hasn't closed too soon. Try to keep the area as clean and dry as possible. It is okay to let warm soapy water run over the area, but do NOT scrub the area.   I am placing you on a course of antibiotics. It is important you finish the entire course! You can take your prescribed pain medication as needed.   You can use an antiseptic (chlorhexidine) soap from the pharmacy 1-2 x per month in the areas where abscesses are most likely to form (armpits, buttocks, groin). This soap can dry your skin out so use it sparingly. Once do so once this area has fully healed.   I've also sent some prednisone for your gout.   Continue to monitor how you're doing and return to the ER for new or worsening symptoms.

## 2023-05-21 NOTE — ED Triage Notes (Signed)
Pt c/o insect bites on chest, abdomen, buttocks, and legs x14.  Pt reports bites happened around 5 days ago.    Pt c/o L elbow pain starting today.  Pt reports he moved arm and  elbows "popped."

## 2023-05-21 NOTE — ED Provider Notes (Signed)
Accepted handoff at shift change from [facility]. Please see prior provider note for full HPI.  Briefly: Patient is a 62 y.o. male who presents to the ER for multiple abscess and L arm pain after injury. Hx of gout.  DDX/Plan: Abscesses incised and drained. Pending L elbow XR and hopefully dc to home with possible gout treatment, antibiotics with MRSA coverage, and pain medication.   ED Course / MDM    Medical Decision Making Amount and/or Complexity of Data Reviewed Labs: ordered. Radiology: ordered.  Risk OTC drugs. Prescription drug management.   XR of left elbow negative for acute findings.   Reevaluated patient. He is comfortable with plan for home with prescribed antibiotics, prednisone for possible gout (he says this is what works best for him) vs elbow sprain, and a few days of pain medication. Patient discharged in stable condition and all questions answered.     Jeanella Flattery 05/21/23 2109    Derwood Kaplan, MD 05/22/23 2256

## 2023-08-24 ENCOUNTER — Encounter: Payer: Self-pay | Admitting: *Deleted
